# Patient Record
Sex: Female | Born: 1950
Health system: Southern US, Community
[De-identification: ages and names within clinical notes are randomized; demographics above are authoritative.]

## PROBLEM LIST (undated history)

## (undated) DIAGNOSIS — B009 Herpesviral infection, unspecified: Secondary | ICD-10-CM

## (undated) DIAGNOSIS — D649 Anemia, unspecified: Secondary | ICD-10-CM

## (undated) DIAGNOSIS — E559 Vitamin D deficiency, unspecified: Secondary | ICD-10-CM

## (undated) DIAGNOSIS — E119 Type 2 diabetes mellitus without complications: Secondary | ICD-10-CM

## (undated) DIAGNOSIS — T8859XA Other complications of anesthesia, initial encounter: Secondary | ICD-10-CM

## (undated) DIAGNOSIS — M1990 Unspecified osteoarthritis, unspecified site: Secondary | ICD-10-CM

## (undated) DIAGNOSIS — Z972 Presence of dental prosthetic device (complete) (partial): Secondary | ICD-10-CM

## (undated) DIAGNOSIS — E7800 Pure hypercholesterolemia, unspecified: Secondary | ICD-10-CM

## (undated) DIAGNOSIS — M7989 Other specified soft tissue disorders: Secondary | ICD-10-CM

## (undated) DIAGNOSIS — T4145XA Adverse effect of unspecified anesthetic, initial encounter: Secondary | ICD-10-CM

## (undated) DIAGNOSIS — I739 Peripheral vascular disease, unspecified: Secondary | ICD-10-CM

## (undated) DIAGNOSIS — Z87898 Personal history of other specified conditions: Secondary | ICD-10-CM

## (undated) DIAGNOSIS — I10 Essential (primary) hypertension: Secondary | ICD-10-CM

## (undated) HISTORY — PX: ROTATOR CUFF REPAIR: SHX139

## (undated) HISTORY — DX: Peripheral vascular disease, unspecified: I73.9

## (undated) HISTORY — PX: COLONOSCOPY: SHX174

## (undated) HISTORY — PX: VARICOSE VEIN SURGERY: SHX832

---
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
## 2012-11-21 ENCOUNTER — Ambulatory Visit: Payer: Self-pay | Admitting: Internal Medicine

## 2015-01-11 ENCOUNTER — Inpatient Hospital Stay: Payer: Self-pay | Admitting: Internal Medicine

## 2015-01-11 LAB — BASIC METABOLIC PANEL
Anion Gap: 6 — ABNORMAL LOW (ref 7–16)
BUN: 15 mg/dL (ref 7–18)
Calcium, Total: 9.1 mg/dL (ref 8.5–10.1)
Chloride: 99 mmol/L (ref 98–107)
Co2: 30 mmol/L (ref 21–32)
Creatinine: 1.1 mg/dL (ref 0.60–1.30)
EGFR (African American): >60
EGFR (Non-African Amer.): 53 — ABNORMAL LOW
Glucose: 106 mg/dL — ABNORMAL HIGH (ref 65–99)
Osmolality: 271 (ref 275–301)
Potassium: 3.3 mmol/L — ABNORMAL LOW (ref 3.5–5.1)
Sodium: 135 mmol/L — ABNORMAL LOW (ref 136–145)

## 2015-01-11 LAB — CBC
HCT: 44.1 % (ref 35.0–47.0)
HGB: 14.2 g/dL (ref 12.0–16.0)
MCH: 22.8 pg — ABNORMAL LOW (ref 26.0–34.0)
MCHC: 32.2 g/dL (ref 32.0–36.0)
MCV: 71 fL — ABNORMAL LOW (ref 80–100)
Platelet: 139 10*3/uL — ABNORMAL LOW (ref 150–440)
RBC: 6.23 10*6/uL — ABNORMAL HIGH (ref 3.80–5.20)
RDW: 15.7 % — ABNORMAL HIGH (ref 11.5–14.5)
WBC: 7.7 10*3/uL (ref 3.6–11.0)

## 2015-01-11 LAB — TROPONIN I: Troponin-I: <0.02 ng/mL

## 2015-01-12 LAB — CBC WITH DIFFERENTIAL/PLATELET
Basophil #: 0 10*3/uL (ref 0.0–0.1)
Basophil %: 0.8 %
Eosinophil #: 0.1 10*3/uL (ref 0.0–0.7)
Eosinophil %: 1.6 %
HCT: 37.1 % (ref 35.0–47.0)
HGB: 12.2 g/dL (ref 12.0–16.0)
Lymphocyte #: 0.7 10*3/uL — ABNORMAL LOW (ref 1.0–3.6)
Lymphocyte %: 12.4 %
MCH: 23.4 pg — ABNORMAL LOW (ref 26.0–34.0)
MCHC: 32.8 g/dL (ref 32.0–36.0)
MCV: 71 fL — ABNORMAL LOW (ref 80–100)
Monocyte #: 0.7 x10 3/mm (ref 0.2–0.9)
Monocyte %: 13 %
Neutrophil #: 4.1 10*3/uL (ref 1.4–6.5)
Neutrophil %: 72.2 %
Platelet: 113 10*3/uL — ABNORMAL LOW (ref 150–440)
RBC: 5.21 10*6/uL — ABNORMAL HIGH (ref 3.80–5.20)
RDW: 15.5 % — ABNORMAL HIGH (ref 11.5–14.5)
WBC: 5.6 10*3/uL (ref 3.6–11.0)

## 2015-01-12 LAB — TROPONIN I
Troponin-I: <0.02 ng/mL
Troponin-I: <0.02 ng/mL

## 2015-01-12 LAB — BASIC METABOLIC PANEL
Anion Gap: 7 (ref 7–16)
BUN: 15 mg/dL (ref 7–18)
Calcium, Total: 8.3 mg/dL — ABNORMAL LOW (ref 8.5–10.1)
Chloride: 105 mmol/L (ref 98–107)
Co2: 27 mmol/L (ref 21–32)
Creatinine: 1.02 mg/dL (ref 0.60–1.30)
EGFR (African American): >60
EGFR (Non-African Amer.): 58 — ABNORMAL LOW
Glucose: 96 mg/dL (ref 65–99)
Osmolality: 278 (ref 275–301)
Potassium: 3.5 mmol/L (ref 3.5–5.1)
Sodium: 139 mmol/L (ref 136–145)

## 2015-01-12 LAB — INFLUENZA A,B,H1N1 - PCR (ARMC)
H1N1 flu by pcr: NOT DETECTED
Influenza A By PCR: NEGATIVE
Influenza B By PCR: NEGATIVE

## 2015-01-13 LAB — BASIC METABOLIC PANEL
Anion Gap: 6 — ABNORMAL LOW (ref 7–16)
BUN: 13 mg/dL (ref 7–18)
Calcium, Total: 7.9 mg/dL — ABNORMAL LOW (ref 8.5–10.1)
Chloride: 110 mmol/L — ABNORMAL HIGH (ref 98–107)
Co2: 27 mmol/L (ref 21–32)
Creatinine: 0.91 mg/dL (ref 0.60–1.30)
EGFR (African American): >60
EGFR (Non-African Amer.): >60
Glucose: 116 mg/dL — ABNORMAL HIGH (ref 65–99)
Osmolality: 286 (ref 275–301)
Potassium: 3.7 mmol/L (ref 3.5–5.1)
Sodium: 143 mmol/L (ref 136–145)

## 2015-01-13 LAB — WBC: WBC: 4.3 10*3/uL (ref 3.6–11.0)

## 2015-01-14 LAB — BASIC METABOLIC PANEL
Anion Gap: 6 — ABNORMAL LOW (ref 7–16)
BUN: 10 mg/dL (ref 7–18)
Calcium, Total: 9.2 mg/dL (ref 8.5–10.1)
Chloride: 109 mmol/L — ABNORMAL HIGH (ref 98–107)
Co2: 27 mmol/L (ref 21–32)
Creatinine: 0.73 mg/dL (ref 0.60–1.30)
EGFR (African American): >60
EGFR (Non-African Amer.): >60
Glucose: 110 mg/dL — ABNORMAL HIGH (ref 65–99)
Osmolality: 283 (ref 275–301)
Potassium: 3.6 mmol/L (ref 3.5–5.1)
Sodium: 142 mmol/L (ref 136–145)

## 2015-01-14 LAB — CBC WITH DIFFERENTIAL/PLATELET
Basophil #: 0 10*3/uL (ref 0.0–0.1)
Basophil %: 0.8 %
Eosinophil #: 0.2 10*3/uL (ref 0.0–0.7)
Eosinophil %: 4.1 %
HCT: 41.2 % (ref 35.0–47.0)
HGB: 13.2 g/dL (ref 12.0–16.0)
Lymphocyte #: 1.2 10*3/uL (ref 1.0–3.6)
Lymphocyte %: 28.6 %
MCH: 22.9 pg — ABNORMAL LOW (ref 26.0–34.0)
MCHC: 31.9 g/dL — ABNORMAL LOW (ref 32.0–36.0)
MCV: 72 fL — ABNORMAL LOW (ref 80–100)
Monocyte #: 0.6 x10 3/mm (ref 0.2–0.9)
Monocyte %: 15.1 %
Neutrophil #: 2.2 10*3/uL (ref 1.4–6.5)
Neutrophil %: 51.4 %
Platelet: 151 10*3/uL (ref 150–440)
RBC: 5.75 10*6/uL — ABNORMAL HIGH (ref 3.80–5.20)
RDW: 15.8 % — ABNORMAL HIGH (ref 11.5–14.5)
WBC: 4.2 10*3/uL (ref 3.6–11.0)

## 2015-01-14 LAB — BETA STREP CULTURE(ARMC)

## 2015-02-14 ENCOUNTER — Emergency Department: Payer: Self-pay | Admitting: Emergency Medicine

## 2015-04-10 NOTE — Discharge Summary (Signed)
PATIENT NAME:  Sara Ballard, [name]#:  [id number] DATE OF BIRTH:  12/09/1951  DATE OF ADMISSION:  01/11/2015 DATE OF DISCHARGE:  01/14/2015  ADMITTING PHYSICIAN: Cletis Athensavid K. Hower, MD   DISCHARGING PHYSICIAN: Enid Baasadhika Clifford Coudriet, MD  PRIMARY CARE PHYSICIAN: Nonlocal.   CONSULTATIONS IN THE HOSPITAL: None.   DISCHARGE DIAGNOSES:  1. Sepsis. 2. Left lower lobe pneumonia.  3. Hypertension.  4. Hyperlipidemia.   DISCHARGE HOME MEDICATIONS:  1. Hydrocortisone 0.2% ointment topically to white patches once a day.  2. Lipitor 10 mg p.o. daily.  3. Losartan 25 mg p.o. daily.  4. Aspirin 81 mg p.o. daily.  5. Levaquin 750 mg p.o. daily for 4 more days.  6. Cepacol lozenges every 6 hours as needed for cough, sore throat.  7. Cetirizine 10 mg daily as needed for allergies.  8. Robitussin DM 10 mL q.6 hours p.r.n. for cough.   DISCHARGE DIET: Low-sodium diet.   DISCHARGE ACTIVITY: Tolerated.    FOLLOWUP INSTRUCTIONS:  PCP followup in 1 to 2 weeks.   HOME OXYGEN: None.   LABORATORY AND IMAGING STUDIES PRIOR TO DISCHARGE: WBC 4.2, hemoglobin 13.2, hematocrit 41.2, platelet count 151,000.   Sodium 142, potassium 3.6, chloride 109, bicarbonate 27, BUN 10, creatinine 0.73, glucose 110, and calcium of 9.2. Troponins negative. Influenza test is negative. Throat cultures negative for beta streptococcus. Chest x-ray on admission showing left lower lobe airspace disease concerning for pneumonia.   BRIEF HOSPITAL COURSE: Sara Ballard is a 64 year old African American female with past medical history significant for hypertension, hyperlipidemia and allergies, who presents to the hospital secondary to worsening upper respiratory tract symptoms, fevers, chills, and tachycardia.   1. Sepsis secondary to left lower lobe pneumonia. Started on IV Levaquin. Blood cultures remain negative. Clinically responded very well to the antibiotics and her cough is improved. She is being discharged home on oral Levaquin  to finish off the course.  2. All her other home medications are being continued without any changes. The patient does say she has some fullness in her ears and has seasonal allergies, so I advised to take Zyrtec as needed and ear, nose and throat followup.   DISCHARGE CONDITION: Stable.   DISCHARGE DISPOSITION: Home.   TIME SPENT ON DISCHARGE: 40 minutes added.   ____________________________ Enid Baasadhika Kairi Tufo, MD rk:ap D: 01/14/2015 15:43:37 ET T: 01/14/2015 16:36:28 ET JOB#: [id number]  cc: Enid Baasadhika Barnell Shieh, MD, <Dictator> Enid BaasADHIKA Dayannara Pascal MD ELECTRONICALLY SIGNED 01/17/2015 17:09

## 2015-04-10 NOTE — H&P (Signed)
PATIENT NAME:  Sara Ballard, Sara Ballard MR#:  [id number] DATE OF BIRTH:  1951-03-08  DATE OF ADMISSION:  01/11/2015  REFERRING PHYSICIAN:  Kathreen Devoid. Paduchowski, MD  PRIMARY CARE PHYSICIAN:  Nonlocal  CHIEF COMPLAINT:  Cough.   HISTORY OF PRESENT ILLNESS:  A 64 year old African-American female with history of hypertension, essential, and hyperlipidemia, unspecified, presenting with a cough. She describes a 2-day duration of cough productive of greenish sputum with associated subjective fevers and chills as well as generalized malaise and fatigue. She denies any chest pain or further symptomatology.   REVIEW OF SYSTEMS: CONSTITUTIONAL:  Positive for fevers, chills, fatigue, and weakness.  EYES:  Denies blurred vision, double vision, or eye pain.  EARS, NOSE, AND THROAT:  Denies tinnitus, ear pain, or hearing loss.  RESPIRATORY:  Positive for cough. Denies wheezing or shortness of breath.  CARDIOVASCULAR:  No chest pain, palpitations, or edema.  GASTROINTESTINAL:  Denies nausea, vomiting, diarrhea, or abdominal pain.  GENITOURINARY:  Denies dysuria or hematuria.  ENDOCRINE:  Denies nocturia or thyroid problems.  HEMATOLOGIC AND LYMPHATIC:  Denies easy bruising or bleeding.  SKIN:  Denies rash or lesions.  MUSCULOSKELETAL:  Denies pain in the neck, back, shoulders, knees, or hips or arthritic symptoms.  NEUROLOGIC:  Denies paralysis or paresthesias.  PSYCHIATRIC:  Denies anxiety or depressive symptoms.   Otherwise, full review of systems performed by me is negative.   PAST MEDICAL HISTORY:  Hypertension, essential; hyperlipidemia, unspecified.   SOCIAL HISTORY:  Denies alcohol, tobacco, or drug usage.   FAMILY HISTORY:  Positive for coronary artery disease on father's side of the family.   ALLERGIES:  PENICILLIN, SULFA DRUGS.   HOME MEDICATIONS:  Include losartan 25 mg p.o. daily, Lipitor 10 mg p.o. at bedtime, hydrochlorothiazide unknown dosage once daily.   PHYSICAL EXAMINATION: VITAL  SIGNS:  Temperature 102.1 degrees Fahrenheit, heart rate 102, respirations 20, blood pressure 132/91, and saturating 96% on room air. Weight is 79.8 kg, BMI 30.2.  GENERAL:  Ill-appearing African-American female currently in minimal distress.  HEAD:  Normocephalic, atraumatic.  EYES:  Pupils are equal, round, and reactive to light. Extraocular muscles are intact. No scleral icterus.  MOUTH:  Moist mucosal membranes. Dentition is intact. No abscess noted.  EARS, NOSE, AND THROAT:  Clear without exudates. No external lesions.  NECK:  Supple. No thyromegaly. No nodules. No JVD.  PULMONARY:  Diminished breath sounds secondary to poor respiratory effort; however, no frank wheezes, rales, or rhonchi. No use of accessory muscles. Poor respiratory effort as stated above.  CHEST:  Nontender to palpation.  CARDIOVASCULAR:  S1 and S2, regular rate and rhythm. No murmurs, rubs, or gallops. No edema. Pedal pulses are 2+ bilaterally.  GASTROINTESTINAL:  Soft, nontender, nondistended. No masses. Positive bowel sounds. No hepatosplenomegaly.  MUSCULOSKELETAL:  No swelling, clubbing, or edema. Range of motion is full in all extremities.  NEUROLOGIC:  Cranial nerves II through XII are intact. No gross focal neurological deficits. Sensation is intact. Reflexes are intact.  SKIN:  No ulcerations, lesions, rash, or cyanosis. Skin is warm and dry. Turgor is intact.  PSYCHIATRIC:  Mood and affect are within normal limits. The patient is awake, alert, and oriented x 3. Insight and judgment are intact.   LABORATORY DATA:  Sodium is 135, potassium 3.3, chloride 99, bicarbonate 30, BUN 15, creatinine 1.1, glucose 106. Troponin is less than 0.02. WBC is 7.7, hemoglobin 14.2, and platelets are 139,000.   Chest x-ray performed reveals hazy left lower lobe airspace disease concerning for pneumonia.  EKG performed reveals inferior lateral T inversions, however, no prior for comparison.   ASSESSMENT AND PLAN:  A 64 year old  African-American female with history of hypertension, essential; hyperlipidemia, unspecified; presenting with a cough and found to have pneumonia.   1.  Community-acquired pneumonia. Supplemental O2 to keep SaO2 greater than 92%. DuoNeb treatments q. 4 hours. Antibiotic coverage with Levaquin.  2.  Abnormal EKG without baseline. We will place on telemetry. Trend cardiac enzymes x 3. She has no cardiac symptoms.  3.  Hyponatremia. IV fluid hydration. Follow sodium level.  4.  Hypokalemia. Replace to goal of 4 to 5.  5.  Hypertension, essential. Continue with home medications, hold hydrochlorothiazide.  6.  Hyperlipidemia. Continue with statin therapy.  7.  Venous thromboembolism prophylaxis with heparin subcutaneously.   CODE STATUS:  The patient is a full code.   TIME SPENT:  45 minutes.    ____________________________ Cletis Athensavid K. Hower, MD dkh:nb D: 01/11/2015 23:19:07 ET T: 01/12/2015 00:27:13 ET JOB#: [id number]  cc: Cletis Athensavid K. Hower, MD, <Dictator> DAVID Synetta ShadowK HOWER MD ELECTRONICALLY SIGNED 01/12/2015 20:39

## 2015-11-24 ENCOUNTER — Other Ambulatory Visit: Payer: Self-pay | Admitting: Family Medicine

## 2015-11-24 DIAGNOSIS — N644 Mastodynia: Secondary | ICD-10-CM

## 2016-09-12 ENCOUNTER — Other Ambulatory Visit (INDEPENDENT_AMBULATORY_CARE_PROVIDER_SITE_OTHER): Payer: Self-pay | Admitting: Vascular Surgery

## 2016-09-12 DIAGNOSIS — I8311 Varicose veins of right lower extremity with inflammation: Secondary | ICD-10-CM

## 2016-09-13 ENCOUNTER — Ambulatory Visit (INDEPENDENT_AMBULATORY_CARE_PROVIDER_SITE_OTHER): Payer: 59

## 2016-09-13 DIAGNOSIS — I8311 Varicose veins of right lower extremity with inflammation: Secondary | ICD-10-CM | POA: Diagnosis not present

## 2016-09-27 ENCOUNTER — Ambulatory Visit (INDEPENDENT_AMBULATORY_CARE_PROVIDER_SITE_OTHER): Payer: 59 | Admitting: Vascular Surgery

## 2016-09-27 ENCOUNTER — Encounter (INDEPENDENT_AMBULATORY_CARE_PROVIDER_SITE_OTHER): Payer: Self-pay | Admitting: Vascular Surgery

## 2016-09-27 VITALS — BP 127/88 | HR 62 | Resp 15 | Ht 64.5 in | Wt 183.0 lb

## 2016-09-27 DIAGNOSIS — I8312 Varicose veins of left lower extremity with inflammation: Secondary | ICD-10-CM

## 2016-09-27 DIAGNOSIS — M79604 Pain in right leg: Secondary | ICD-10-CM

## 2016-09-27 DIAGNOSIS — I8311 Varicose veins of right lower extremity with inflammation: Secondary | ICD-10-CM

## 2016-09-27 DIAGNOSIS — M79605 Pain in left leg: Secondary | ICD-10-CM

## 2016-09-27 DIAGNOSIS — I872 Venous insufficiency (chronic) (peripheral): Secondary | ICD-10-CM

## 2016-09-27 DIAGNOSIS — M79609 Pain in unspecified limb: Secondary | ICD-10-CM | POA: Insufficient documentation

## 2016-09-27 NOTE — Progress Notes (Signed)
Left GSV laser ablation

## 2016-09-27 NOTE — Progress Notes (Signed)
The patient's left lower extremity was sterilely prepped and draped.  The ultrasound machine was used to visualize the left great saphenous vein throughout its course.  A segment below the knee was selected for access.  The saphenous vein was accessed without difficulty using ultrasound guidance with a micropuncture needle.   An 0.018  wire was placed beyond the saphenofemoral junction through the sheath and the microneedle was removed.  The 65 cm sheath was then placed over the wire and the wire and dilator were removed.  The laser fiber was placed through the sheath and its tip was placed approximately 2 cm below the saphenofemoral junction.  Tumescent anesthesia was then created with a dilute lidocaine solution.  Laser energy was then delivered with constant withdrawal of the sheath and laser fiber.  Approximately 1492 Joules of energy were delivered over a length of 41 cm.  Sterile dressings were placed.  The patient tolerated the procedure well without complications.

## 2016-10-01 ENCOUNTER — Ambulatory Visit (INDEPENDENT_AMBULATORY_CARE_PROVIDER_SITE_OTHER): Payer: 59

## 2016-10-01 DIAGNOSIS — I8312 Varicose veins of left lower extremity with inflammation: Secondary | ICD-10-CM

## 2016-10-01 DIAGNOSIS — I8311 Varicose veins of right lower extremity with inflammation: Secondary | ICD-10-CM

## 2016-10-15 ENCOUNTER — Ambulatory Visit (INDEPENDENT_AMBULATORY_CARE_PROVIDER_SITE_OTHER): Payer: 59 | Admitting: Vascular Surgery

## 2016-11-05 ENCOUNTER — Ambulatory Visit (INDEPENDENT_AMBULATORY_CARE_PROVIDER_SITE_OTHER): Payer: 59 | Admitting: Vascular Surgery

## 2016-11-05 ENCOUNTER — Encounter (INDEPENDENT_AMBULATORY_CARE_PROVIDER_SITE_OTHER): Payer: Self-pay | Admitting: Vascular Surgery

## 2016-11-05 VITALS — BP 148/100 | HR 67 | Resp 17 | Ht 64.0 in | Wt 186.0 lb

## 2016-11-05 DIAGNOSIS — I872 Venous insufficiency (chronic) (peripheral): Secondary | ICD-10-CM

## 2016-11-05 DIAGNOSIS — M79605 Pain in left leg: Secondary | ICD-10-CM | POA: Diagnosis not present

## 2016-11-05 DIAGNOSIS — M79604 Pain in right leg: Secondary | ICD-10-CM

## 2016-11-05 DIAGNOSIS — I83813 Varicose veins of bilateral lower extremities with pain: Secondary | ICD-10-CM

## 2016-11-05 NOTE — Progress Notes (Signed)
MRN : [id number]  Sara Ballard is a 65 y.o. (05/31/1951) female who presents with chief complaint of  Chief Complaint  Patient presents with  . Follow-up  .  History of Present Illness: The patient returns to the office for followup status post laser ablation of the great saphenous vein. The patient notes multiple residual varicosities bilaterally which continued to hurt with dependent positions and remained tender to palpation. The patient's swelling is unchanged from preoperative status. The patient continues to wear graduated compression stockings on a daily basis but these are not eliminating the pain and discomfort. The patient continues to use over-the-counter anti-inflammatory medications to treat the pain and related symptoms but this has not given the patient relief. The patient notes the pain in the lower extremities is causing problems with daily exercise, problems at work and even with household activities such as preparing meals and doing dishes.  The patient is otherwise done well and there have been no complications related to the laser procedure or interval changes in the patient's overall   Venous ultrasound post laser shows successful laser ablation, no DVT identified. Current Meds  Medication Sig  . aspirin EC 81 MG tablet Take by mouth.  . baclofen (LIORESAL) 10 MG tablet Take 10 mg by mouth 3 (three) times daily.  . calcium acetate (PHOSLO) 667 MG capsule Take by mouth 3 (three) times daily with meals.  . Cholecalciferol (VITAMIN D-1000 MAX ST) 1000 units tablet Take by mouth.  . ferrous sulfate 325 (65 FE) MG tablet Take 325 mg by mouth daily with breakfast.  . gabapentin (NEURONTIN) 100 MG capsule Take by mouth.  . Loratadine 10 MG CAPS Take by mouth.  . losartan-hydrochlorothiazide (HYZAAR) 100-25 MG tablet Take by mouth.  . Vitamin D, Ergocalciferol, (DRISDOL) 50000 units CAPS capsule Take by mouth.  . Vitamin E-Tocotrienols (MAXILIFE RICE TOCOTRIENOLS PO)  Take by mouth.    Past Medical History:  Diagnosis Date  . Peripheral vascular disease (HCC)     No past surgical history on file.  Social History Social History  Substance Use Topics  . Smoking status: Never Smoker  . Smokeless tobacco: Never Used  . Alcohol use No    Family History No family history on file. No family history of bleeding/clotting disorders, porphyria or autoimmune disease   Allergies  Allergen Reactions  . Atorvastatin Other (See Comments)    arthralgia  . Lisinopril Nausea Only  . Amlodipine Nausea Only  . Penicillin G Nausea Only  . Sulfur Other (See Comments)  . Sulfa Antibiotics Palpitations     REVIEW OF SYSTEMS (Negative unless checked)  Constitutional: [] Weight loss  [] Fever  [] Chills Cardiac: [] Chest pain   [] Chest pressure   [] Palpitations   [] Shortness of breath when laying flat   [] Shortness of breath with exertion. Vascular:  [] Pain in legs with walking   [] Pain in legs at rest  [] History of DVT   [] Phlebitis   [x] Swelling in legs   [x] Varicose veins   [] Non-healing ulcers Pulmonary:   [] Uses home oxygen   [] Productive cough   [] Hemoptysis   [] Wheeze  [] COPD   [] Asthma Neurologic:  [] Dizziness   [] Seizures   [] History of stroke   [] History of TIA  [] Aphasia   [] Vissual changes   [] Weakness or numbness in arm   [] Weakness or numbness in leg Musculoskeletal:   [] Joint swelling   [] Joint pain   [] Low back pain Hematologic:  [] Easy bruising  [] Easy bleeding   [] Hypercoagulable state   []   Anemic Gastrointestinal:  [] Diarrhea   [] Vomiting  [] Gastroesophageal reflux/heartburn   [] Difficulty swallowing. Genitourinary:  [] Chronic kidney disease   [] Difficult urination  [] Frequent urination   [] Blood in urine Skin:  [] Rashes   [] Ulcers  Psychological:  [] History of anxiety   []  History of major depression.  Physical Examination  Vitals:   11/05/16 1118  BP: (!) 148/100  Pulse: 67  Resp: 17  Weight: 186 lb (84.4 kg)  Height: 5\' 4"  (1.626 m)     Body mass index is 31.93 kg/m. Gen: WD/WN, NAD Head: Many/AT, No temporalis wasting.  Ear/Nose/Throat: Hearing grossly intact, nares w/o erythema or drainage, poor dentition Eyes: PER, EOMI, sclera nonicteric.  Neck: Supple, no masses.  No bruit or JVD.  Pulmonary:  Good air movement, clear to auscultation bilaterally, no use of accessory muscles.  Cardiac: RRR, normal S1, S2, no Murmurs. Vascular:  Large varicose veins noted >10 mm Vessel Right Left  Radial Palpable Palpable  Ulnar Palpable Palpable  Brachial Palpable Palpable  Carotid Palpable Palpable  Femoral Palpable Palpable  Popliteal Palpable Palpable  PT Palpable Palpable  DP Palpable Palpable   Gastrointestinal: soft, non-distended. No guarding/no peritoneal signs.  Musculoskeletal: M/S 5/5 throughout.  No deformity or atrophy.  Neurologic: CN 2-12 intact. Pain and light touch intact in extremities.  Symmetrical.  Speech is fluent. Motor exam as listed above. Psychiatric: Judgment intact, Mood & affect appropriate for pt's clinical situation. Dermatologic: No rashes or ulcers noted.  No changes consistent with cellulitis. Lymph : No Cervical lymphadenopathy, no lichenification or skin changes of chronic lymphedema.  CBC Lab Results  Component Value Date   WBC 4.2 01/14/2015   HGB 13.2 01/14/2015   HCT 41.2 01/14/2015   MCV 72 (L) 01/14/2015   PLT 151 01/14/2015    BMET    Component Value Date/Time   NA 142 01/14/2015 0649   K 3.6 01/14/2015 0649   CL 109 (H) 01/14/2015 0649   CO2 27 01/14/2015 0649   GLUCOSE 110 (H) 01/14/2015 0649   BUN 10 01/14/2015 0649   CREATININE 0.73 01/14/2015 0649   CALCIUM 9.2 01/14/2015 0649   GFRNONAA >60 01/14/2015 0649   GFRAA >60 01/14/2015 0649   CrCl cannot be calculated (Patient's most recent lab result is older than the maximum 21 days allowed.).  COAG No results found for: INR, PROTIME  Radiology No results found.   Assessment/Plan 1. Pain in both lower  extremities See #2  2. Varicose veins of both lower extremities with pain Recommend:  The patient has had successful ablation of the previously incompetent saphenous venous system but still has persistent symptoms of pain and swelling that are having a negative impact on daily life and daily activities.  Patient should undergo injection sclerotherapy to treat the residual varicosities.  The risks, benefits and alternative therapies were reviewed in detail with the patient.  All questions were answered.  The patient agrees to proceed with sclerotherapy at their convenience.  The patient will continue wearing the graduated compression stockings and using the over-the-counter pain medications to treat her symptoms.    3. Chronic venous insufficiency No surgery or intervention at this point in time.    I have had a long discussion with the patient regarding venous insufficiency and why it  causes symptoms. I have discussed with the patient the chronic skin changes that accompany venous insufficiency and the long term sequela such as infection and ulceration.  Patient will begin wearing graduated compression stockings class 1 (20-30 mmHg)  or compression wraps on a daily basis a prescription was given. The patient will put the stockings on first thing in the morning and removing them in the evening. The patient is instructed specifically not to sleep in the stockings.    In addition, behavioral modification including several periods of elevation of the lower extremities during the day will be continued. I have demonstrated that proper elevation is a position with the ankles at heart level.  The patient is instructed to begin routine exercise, especially walking on a daily basis   Levora DredgeGregory Schnier, MD  11/05/2016 12:51 PM

## 2017-10-24 DIAGNOSIS — F418 Other specified anxiety disorders: Secondary | ICD-10-CM | POA: Diagnosis not present

## 2017-10-24 DIAGNOSIS — I10 Essential (primary) hypertension: Secondary | ICD-10-CM | POA: Diagnosis not present

## 2017-10-24 DIAGNOSIS — Z23 Encounter for immunization: Secondary | ICD-10-CM | POA: Diagnosis not present

## 2018-03-25 DIAGNOSIS — Z780 Asymptomatic menopausal state: Secondary | ICD-10-CM | POA: Diagnosis not present

## 2018-03-25 DIAGNOSIS — Z0000 Encounter for general adult medical examination without abnormal findings: Secondary | ICD-10-CM | POA: Diagnosis not present

## 2018-03-25 DIAGNOSIS — I10 Essential (primary) hypertension: Secondary | ICD-10-CM | POA: Diagnosis not present

## 2018-03-26 ENCOUNTER — Other Ambulatory Visit: Payer: Self-pay | Admitting: Family Medicine

## 2018-03-26 DIAGNOSIS — Z780 Asymptomatic menopausal state: Secondary | ICD-10-CM

## 2018-04-01 DIAGNOSIS — E876 Hypokalemia: Secondary | ICD-10-CM | POA: Diagnosis not present

## 2018-04-22 ENCOUNTER — Other Ambulatory Visit: Payer: Self-pay

## 2018-04-22 ENCOUNTER — Ambulatory Visit
Admission: EM | Admit: 2018-04-22 | Discharge: 2018-04-22 | Disposition: A | Discharge status: Home / Self Care | Payer: PPO | Attending: Family Medicine | Admitting: Family Medicine

## 2018-04-22 DIAGNOSIS — I10 Essential (primary) hypertension: Secondary | ICD-10-CM

## 2018-04-22 DIAGNOSIS — R42 Dizziness and giddiness: Secondary | ICD-10-CM

## 2018-04-22 DIAGNOSIS — R51 Headache: Secondary | ICD-10-CM

## 2018-04-22 LAB — URINALYSIS, COMPLETE (UACMP) WITH MICROSCOPIC
Bacteria, UA: NONE SEEN
Bilirubin Urine: NEGATIVE
Glucose, UA: NEGATIVE mg/dL
Hgb urine dipstick: NEGATIVE
Ketones, ur: NEGATIVE mg/dL
Leukocytes, UA: NEGATIVE
Nitrite: NEGATIVE
Protein, ur: NEGATIVE mg/dL
RBC / HPF: NONE SEEN RBC/hpf (ref 0–5)
Specific Gravity, Urine: 1.01 (ref 1.005–1.030)
pH: 7.5 (ref 5.0–8.0)

## 2018-04-22 LAB — BASIC METABOLIC PANEL
Anion gap: 11 (ref 5–15)
BUN: 15 mg/dL (ref 6–20)
CO2: 28 mmol/L (ref 22–32)
Calcium: 9.4 mg/dL (ref 8.9–10.3)
Chloride: 102 mmol/L (ref 101–111)
Creatinine, Ser: 0.77 mg/dL (ref 0.44–1.00)
GFR calc Af Amer: >60 mL/min (ref >60)
GFR calc non Af Amer: >60 mL/min (ref >60)
Glucose, Bld: 84 mg/dL (ref 65–99)
Potassium: 3.7 mmol/L (ref 3.5–5.1)
Sodium: 141 mmol/L (ref 135–145)

## 2018-04-22 MED ORDER — KETOCONAZOLE 2 % EX CREA: 1.0000 "application " | TOPICAL_CREAM | Freq: Every day | CUTANEOUS | 0 refills | Status: AC

## 2018-04-22 NOTE — ED Provider Notes (Signed)
[facility]    CSN: [id number] Arrival date & time: 04/22/18  1308     History   Chief Complaint Chief Complaint  Patient presents with  . Hypertension    HPI Sara Ballard is a 67 y.o. female.   HPI  67 year old female presents with elevated blood pressure disease (156/105) took at home yesterday.  Also has a slight lightheaded feeling today.  Today she had left-sided neck pain and headache which has been plaguing her for years.  She is recently started an exercise program may have exacerbated it.  She has no upper extremity radicular symptoms.  Was recently placed on propranolol for anxiety and palpitations.  Has been taking losartan hydrochlorothiazide 100-25.  Has had some vaginal irritation recently and was concerned that it may be a urinary tract infection.  She states she has no vaginal discharge no fever chills no nausea vomiting         Past Medical History:  Diagnosis Date  . Peripheral vascular disease [facility])     Patient Active Problem List   Diagnosis Date Noted  . Varicose veins of both lower extremities with pain 11/05/2016  . Pain in limb 09/27/2016  . Chronic venous insufficiency 09/27/2016    Past Surgical History:  Procedure Laterality Date  . VARICOSE VEIN SURGERY      OB History   None      Home Medications    Prior to Admission medications   Medication Sig Start Date End Date Taking? Authorizing Provider  aspirin EC 81 MG tablet Take by mouth.   Yes [provider]  calcium acetate (PHOSLO) 667 MG capsule Take by mouth 3 (three) times daily with meals.   Yes [provider]  Cholecalciferol (VITAMIN D-1000 MAX ST) 1000 units tablet Take by mouth.   Yes [provider]  ferrous sulfate 325 (65 FE) MG tablet Take 325 mg by mouth daily with breakfast.   Yes [provider]  Glucos-Chondroit-Collag-Hyal (GLUCOSAMINE CHONDROIT-COLLAGEN) CAPS Take by mouth.   Yes [provider]    Loratadine 10 MG CAPS Take by mouth.   Yes [provider]  losartan-hydrochlorothiazide (HYZAAR) 100-25 MG tablet Take by mouth. 09/26/16  Yes [provider]  Multiple Vitamin (MULTI-VITAMINS) TABS Take by mouth.   Yes [provider]  polyethylene glycol powder (GLYCOLAX/MIRALAX) powder Take as directed for colonoscopy prep. 04/04/16  Yes [provider]  potassium chloride (K-DUR,KLOR-CON) 10 MEQ tablet Take by mouth. 04/02/18 04/02/19 Yes [provider]  pravastatin (PRAVACHOL) 20 MG tablet  03/26/18  Yes [provider]  propranolol (INDERAL) 20 MG tablet Take by mouth. 10/24/17 10/24/18 Yes [provider]  Vitamin E-Tocotrienols (MAXILIFE RICE TOCOTRIENOLS PO) Take by mouth.   Yes [provider]  ketoconazole (NIZORAL) 2 % cream Apply 1 application topically daily. 04/22/18   Lutricia Feil, PA-C    Family History Family History  Problem Relation Age of Onset  . Diabetes Mother   . Heart disease Father     Social History Social History   Tobacco Use  . Smoking status: Never Smoker  . Smokeless tobacco: Never Used  Substance Use Topics  . Alcohol use: No  . Drug use: No     Allergies   Atorvastatin; Lisinopril; Amlodipine; Penicillin g; Sulfur; and Sulfa antibiotics   Review of Systems Review of Systems  Constitutional: Negative for activity change, appetite change, chills, fatigue and fever.  Genitourinary: Negative for vaginal discharge.  Neurological: Positive for  light-headedness.  All other systems reviewed and are negative.    Physical Exam Triage Vital Signs ED Triage Vitals  Enc Vitals Group     BP 04/22/18 1321 (!) 147/95     Pulse Rate 04/22/18 1321 62     Resp 04/22/18 1321 17     Temp 04/22/18 1321 98.4 F (36.9 C)     Temp Source 04/22/18 1321 Oral     SpO2 04/22/18 1321 99 %     Weight 04/22/18 1320 179 lb (81.2 kg)     Height 04/22/18 1320  (1.651 m)     Head  Circumference --      Peak Flow --      Pain Score 04/22/18 1320 2     Pain Loc --      Pain Edu? --      Excl. in GC? --    No data found.  Updated Vital Signs BP (!) 136/97 (BP Location: Right Arm)   Pulse 62   Temp 98.4 F (36.9 C) (Oral)   Resp 17   Ht  (1.651 m)   Wt 179 lb (81.2 kg)   SpO2 99%   BMI 29.79 kg/m   Visual Acuity Right Eye Distance:   Left Eye Distance:   Bilateral Distance:    Right Eye Near:   Left Eye Near:    Bilateral Near:     Physical Exam  Constitutional: She is oriented to person, place, and time. She appears well-developed and well-nourished. No distress.  HENT:  Head: Normocephalic.  Eyes: Pupils are equal, round, and reactive to light. Right eye exhibits no discharge. Left eye exhibits no discharge.  Neck: Normal range of motion.  Pulmonary/Chest: Effort normal and breath sounds normal.  Musculoskeletal: Normal range of motion.  Neurological: She is alert and oriented to person, place, and time.  Skin: Skin is warm and dry. She is not diaphoretic.  Psychiatric: She has a normal mood and affect. Her behavior is normal. Judgment and thought content normal.  Nursing note and vitals reviewed.    UC Treatments / Results  Labs (all labs ordered are listed, but only abnormal results are displayed) Labs Reviewed  URINALYSIS, COMPLETE (UACMP) WITH MICROSCOPIC - Abnormal; Notable for the following components:      Result Value   Color, Urine STRAW (*)    All other components within normal limits  BASIC METABOLIC PANEL    EKG None  Radiology No results found.  Procedures Procedures (including critical care time)  Medications Ordered in UC Medications - No data to display  Initial Impression / Assessment and Plan / UC Course  I have reviewed the triage vital signs and the nursing notes.  Pertinent labs & imaging results that were available during my care of the patient were reviewed by me and considered in my medical  decision making (see chart for details).     Plan: 1. Test/x-ray results and diagnosis reviewed with patient 2. rx as per orders; risks, benefits, potential side effects reviewed with patient 3. Recommend supportive treatment with developing a blood pressure log date time blood pressure and pulse taken randomly 3 times a week give this to the primary care physician at your next visit.  For her external vaginal irritation I will provide her with Nizoral cream.  If that does not improve she should also follow-up with her primary care physician. Assured that her blood pressure is not urgent today although they may seem high to her. It  was at an Acceptable level at the time of her discharge. 4. F/u prn if symptoms worsen or don't improve  Final Clinical Impressions(s) / UC Diagnoses   Final diagnoses:  Essential hypertension   Discharge Instructions   None    ED Prescriptions    Medication Sig Dispense Auth. Provider   ketoconazole (NIZORAL) 2 % cream Apply 1 application topically daily. 15 g Lutricia Feil, PA-C     Controlled Substance Prescriptions [place] Controlled Substance Registry consulted? Not Applicable   Lutricia Feil, PA-C 04/22/18 [id number]

## 2018-04-22 NOTE — ED Triage Notes (Signed)
Patient complains of elevated blood pressure (156/105) that started yesterday. Patient states that she has noticed a slight lightheaded feeling. Patient complains of left side neck pain and headache (nagging).

## 2018-07-10 DIAGNOSIS — Z1239 Encounter for other screening for malignant neoplasm of breast: Secondary | ICD-10-CM | POA: Diagnosis not present

## 2018-07-10 DIAGNOSIS — I10 Essential (primary) hypertension: Secondary | ICD-10-CM | POA: Diagnosis not present

## 2018-07-23 DIAGNOSIS — M8588 Other specified disorders of bone density and structure, other site: Secondary | ICD-10-CM | POA: Diagnosis not present

## 2018-07-23 DIAGNOSIS — Z1231 Encounter for screening mammogram for malignant neoplasm of breast: Secondary | ICD-10-CM | POA: Diagnosis not present

## 2018-07-23 DIAGNOSIS — Z780 Asymptomatic menopausal state: Secondary | ICD-10-CM | POA: Diagnosis not present

## 2018-07-23 DIAGNOSIS — R928 Other abnormal and inconclusive findings on diagnostic imaging of breast: Secondary | ICD-10-CM | POA: Diagnosis not present

## 2018-08-05 DIAGNOSIS — E876 Hypokalemia: Secondary | ICD-10-CM | POA: Diagnosis not present

## 2018-08-05 DIAGNOSIS — Z0130 Encounter for examination of blood pressure without abnormal findings: Secondary | ICD-10-CM | POA: Diagnosis not present

## 2018-08-25 DIAGNOSIS — R928 Other abnormal and inconclusive findings on diagnostic imaging of breast: Secondary | ICD-10-CM | POA: Diagnosis not present

## 2018-12-17 DIAGNOSIS — S93501A Unspecified sprain of right great toe, initial encounter: Secondary | ICD-10-CM | POA: Diagnosis not present

## 2018-12-17 DIAGNOSIS — M79671 Pain in right foot: Secondary | ICD-10-CM | POA: Diagnosis not present

## 2019-01-22 DIAGNOSIS — Z114 Encounter for screening for human immunodeficiency virus [HIV]: Secondary | ICD-10-CM | POA: Diagnosis not present

## 2019-01-22 DIAGNOSIS — E876 Hypokalemia: Secondary | ICD-10-CM | POA: Diagnosis not present

## 2019-01-22 DIAGNOSIS — I10 Essential (primary) hypertension: Secondary | ICD-10-CM | POA: Diagnosis not present

## 2019-01-22 DIAGNOSIS — N952 Postmenopausal atrophic vaginitis: Secondary | ICD-10-CM | POA: Diagnosis not present

## 2019-01-22 DIAGNOSIS — Z113 Encounter for screening for infections with a predominantly sexual mode of transmission: Secondary | ICD-10-CM | POA: Diagnosis not present

## 2019-01-22 DIAGNOSIS — Z124 Encounter for screening for malignant neoplasm of cervix: Secondary | ICD-10-CM | POA: Diagnosis not present

## 2019-07-23 DIAGNOSIS — Z780 Asymptomatic menopausal state: Secondary | ICD-10-CM | POA: Diagnosis not present

## 2019-07-23 DIAGNOSIS — I10 Essential (primary) hypertension: Secondary | ICD-10-CM | POA: Diagnosis not present

## 2019-07-23 DIAGNOSIS — Z1239 Encounter for other screening for malignant neoplasm of breast: Secondary | ICD-10-CM | POA: Diagnosis not present

## 2019-07-23 DIAGNOSIS — J309 Allergic rhinitis, unspecified: Secondary | ICD-10-CM | POA: Diagnosis not present

## 2019-08-03 ENCOUNTER — Other Ambulatory Visit: Payer: Self-pay | Admitting: Family Medicine

## 2019-08-03 DIAGNOSIS — Z780 Asymptomatic menopausal state: Secondary | ICD-10-CM

## 2019-08-03 DIAGNOSIS — Z1231 Encounter for screening mammogram for malignant neoplasm of breast: Secondary | ICD-10-CM

## 2019-09-22 DIAGNOSIS — H2513 Age-related nuclear cataract, bilateral: Secondary | ICD-10-CM | POA: Diagnosis not present

## 2019-10-07 DIAGNOSIS — I10 Essential (primary) hypertension: Secondary | ICD-10-CM | POA: Diagnosis not present

## 2019-10-07 DIAGNOSIS — H2511 Age-related nuclear cataract, right eye: Secondary | ICD-10-CM | POA: Diagnosis not present

## 2019-10-15 ENCOUNTER — Other Ambulatory Visit: Payer: Self-pay

## 2019-10-15 ENCOUNTER — Other Ambulatory Visit
Admission: RE | Admit: 2019-10-15 | Discharge: 2019-10-15 | Disposition: A | Discharge status: Home / Self Care | Payer: PPO | Source: Ambulatory Visit | Attending: Ophthalmology | Admitting: Ophthalmology

## 2019-10-15 DIAGNOSIS — Z01812 Encounter for preprocedural laboratory examination: Secondary | ICD-10-CM | POA: Diagnosis not present

## 2019-10-15 DIAGNOSIS — Z20828 Contact with and (suspected) exposure to other viral communicable diseases: Secondary | ICD-10-CM | POA: Diagnosis not present

## 2019-10-15 LAB — SARS CORONAVIRUS 2 (TAT 6-24 HRS): SARS Coronavirus 2: NEGATIVE

## 2019-10-15 NOTE — Discharge Instructions (Signed)

## 2019-10-19 ENCOUNTER — Other Ambulatory Visit: Payer: Self-pay

## 2019-10-19 ENCOUNTER — Ambulatory Visit: Payer: PPO | Admitting: Anesthesiology

## 2019-10-19 ENCOUNTER — Encounter
Admission: RE | Disposition: A | Discharge status: Home / Self Care | Payer: Self-pay | Source: Home / Self Care | Attending: Ophthalmology

## 2019-10-19 ENCOUNTER — Ambulatory Visit
Admission: RE | Admit: 2019-10-19 | Discharge: 2019-10-19 | Disposition: A | Discharge status: Home / Self Care | Payer: PPO | Attending: Ophthalmology | Admitting: Ophthalmology

## 2019-10-19 DIAGNOSIS — H2511 Age-related nuclear cataract, right eye: Secondary | ICD-10-CM | POA: Diagnosis not present

## 2019-10-19 DIAGNOSIS — E1136 Type 2 diabetes mellitus with diabetic cataract: Secondary | ICD-10-CM | POA: Diagnosis not present

## 2019-10-19 DIAGNOSIS — H25811 Combined forms of age-related cataract, right eye: Secondary | ICD-10-CM | POA: Diagnosis not present

## 2019-10-19 DIAGNOSIS — E559 Vitamin D deficiency, unspecified: Secondary | ICD-10-CM | POA: Diagnosis not present

## 2019-10-19 DIAGNOSIS — Z79899 Other long term (current) drug therapy: Secondary | ICD-10-CM | POA: Insufficient documentation

## 2019-10-19 DIAGNOSIS — D649 Anemia, unspecified: Secondary | ICD-10-CM | POA: Insufficient documentation

## 2019-10-19 DIAGNOSIS — Z7982 Long term (current) use of aspirin: Secondary | ICD-10-CM | POA: Diagnosis not present

## 2019-10-19 DIAGNOSIS — E7800 Pure hypercholesterolemia, unspecified: Secondary | ICD-10-CM | POA: Insufficient documentation

## 2019-10-19 DIAGNOSIS — I10 Essential (primary) hypertension: Secondary | ICD-10-CM | POA: Insufficient documentation

## 2019-10-19 HISTORY — DX: Type 2 diabetes mellitus without complications: E11.9

## 2019-10-19 HISTORY — DX: Other complications of anesthesia, initial encounter: T88.59XA

## 2019-10-19 HISTORY — DX: Herpesviral infection, unspecified: B00.9

## 2019-10-19 HISTORY — DX: Unspecified osteoarthritis, unspecified site: M19.90

## 2019-10-19 HISTORY — DX: Pure hypercholesterolemia, unspecified: E78.00

## 2019-10-19 HISTORY — DX: Anemia, unspecified: D64.9

## 2019-10-19 HISTORY — DX: Presence of dental prosthetic device (complete) (partial): Z97.2

## 2019-10-19 HISTORY — DX: Other specified soft tissue disorders: M79.89

## 2019-10-19 HISTORY — DX: Vitamin D deficiency, unspecified: E55.9

## 2019-10-19 HISTORY — DX: Personal history of other specified conditions: Z87.898

## 2019-10-19 HISTORY — PX: CATARACT EXTRACTION W/PHACO: SHX586

## 2019-10-19 HISTORY — DX: Essential (primary) hypertension: I10

## 2019-10-19 SURGERY — PHACOEMULSIFICATION, CATARACT, WITH IOL INSERTION
Anesthesia: Monitor Anesthesia Care | Site: Eye | Laterality: Right

## 2019-10-19 MED ORDER — TETRACAINE HCL 0.5 % OP SOLN
1.0000 [drp] | OPHTHALMIC | Status: DC | PRN
  Administered 2019-10-19 (×3): 1 [drp] via OPHTHALMIC

## 2019-10-19 MED ORDER — LIDOCAINE HCL (PF) 2 % IJ SOLN
INTRAOCULAR | Status: DC | PRN
  Administered 2019-10-19: 08:00:00 1 mL via INTRAOCULAR

## 2019-10-19 MED ORDER — MOXIFLOXACIN HCL 0.5 % OP SOLN
OPHTHALMIC | Status: DC | PRN
  Administered 2019-10-19: 0.2 mL via OPHTHALMIC

## 2019-10-19 MED ORDER — ARMC OPHTHALMIC DILATING DROPS
1.0000 "application " | OPHTHALMIC | Status: DC | PRN
  Administered 2019-10-19 (×3): 1 via OPHTHALMIC

## 2019-10-19 MED ORDER — LACTATED RINGERS IV SOLN: INTRAVENOUS | Status: DC

## 2019-10-19 MED ORDER — ONDANSETRON HCL 4 MG/2ML IJ SOLN: 4.0000 mg | Freq: Once | INTRAMUSCULAR | Status: DC | PRN

## 2019-10-19 MED ORDER — EPINEPHRINE PF 1 MG/ML IJ SOLN
INTRAOCULAR | Status: DC | PRN
  Administered 2019-10-19: 08:00:00 76 mL via OPHTHALMIC

## 2019-10-19 MED ORDER — SODIUM HYALURONATE 10 MG/ML IO SOLN
INTRAOCULAR | Status: DC | PRN
  Administered 2019-10-19: 0.55 mL via INTRAOCULAR

## 2019-10-19 MED ORDER — FENTANYL CITRATE (PF) 100 MCG/2ML IJ SOLN
INTRAMUSCULAR | Status: DC | PRN
  Administered 2019-10-19: 50 ug via INTRAVENOUS

## 2019-10-19 MED ORDER — MIDAZOLAM HCL 2 MG/2ML IJ SOLN
INTRAMUSCULAR | Status: DC | PRN
  Administered 2019-10-19: 1 mg via INTRAVENOUS

## 2019-10-19 MED ORDER — SODIUM HYALURONATE 23 MG/ML IO SOLN
INTRAOCULAR | Status: DC | PRN
  Administered 2019-10-19: 0.6 mL via INTRAOCULAR

## 2019-10-19 SURGICAL SUPPLY — 19 items
CANNULA ANT/CHMB 27G (MISCELLANEOUS) ×2 IMPLANT
CANNULA ANT/CHMB 27GA (MISCELLANEOUS) ×6 IMPLANT
DISSECTOR HYDRO NUCLEUS 50X22 (MISCELLANEOUS) ×3 IMPLANT
GLOVE SURG LX 7.5 STRW (GLOVE) ×2
GLOVE SURG LX STRL 7.5 STRW (GLOVE) ×1 IMPLANT
GLOVE SURG SYN 8.5  E (GLOVE) ×2
GLOVE SURG SYN 8.5 E (GLOVE) ×1 IMPLANT
GLOVE SURG SYN 8.5 PF PI (GLOVE) ×1 IMPLANT
GOWN STRL REUS W/ TWL LRG LVL3 (GOWN DISPOSABLE) ×2 IMPLANT
GOWN STRL REUS W/TWL LRG LVL3 (GOWN DISPOSABLE) ×4
LENS IOL TECNIS ITEC 19.5 (Intraocular Lens) ×2 IMPLANT
MARKER SKIN DUAL TIP RULER LAB (MISCELLANEOUS) ×3 IMPLANT
PACK DR. KING ARMS (PACKS) ×3 IMPLANT
PACK EYE AFTER SURG (MISCELLANEOUS) ×3 IMPLANT
PACK OPTHALMIC (MISCELLANEOUS) ×3 IMPLANT
SYR 3ML LL SCALE MARK (SYRINGE) ×3 IMPLANT
SYR TB 1ML LUER SLIP (SYRINGE) ×3 IMPLANT
WATER STERILE IRR 250ML POUR (IV SOLUTION) ×3 IMPLANT
WIPE NON LINTING 3.25X3.25 (MISCELLANEOUS) ×3 IMPLANT

## 2019-10-19 NOTE — Transfer of Care (Signed)
Immediate Anesthesia Transfer of Care Note  Patient: Sara Ballard  Procedure(s) Performed: CATARACT EXTRACTION PHACO AND INTRAOCULAR LENS PLACEMENT (IOC) RIGHT DIABETIC  (Right Eye)  Patient Location: PACU  Anesthesia Type: MAC  Level of Consciousness: awake, alert  and patient cooperative  Airway and Oxygen Therapy: Patient Spontanous Breathing and Patient connected to supplemental oxygen  Post-op Assessment: Post-op Vital signs reviewed, Patient's Cardiovascular Status Stable, Respiratory Function Stable, Patent Airway and No signs of Nausea or vomiting  Post-op Vital Signs: Reviewed and stable  Complications: No apparent anesthesia complications

## 2019-10-19 NOTE — H&P (Signed)

## 2019-10-19 NOTE — Anesthesia Preprocedure Evaluation (Signed)
Anesthesia Evaluation  Patient identified by MRN, date of birth, ID band  Airway Mallampati: II  TM Distance: >3 FB Neck ROM: Full    Dental  (+) Partial Lower, Partial Upper   Pulmonary neg pulmonary ROS,    Pulmonary exam normal        Cardiovascular Exercise Tolerance: Good hypertension, Normal cardiovascular exam     Neuro/Psych negative neurological ROS  negative psych ROS   GI/Hepatic negative GI ROS, Neg liver ROS,   Endo/Other  diabetes  Renal/GU      Musculoskeletal   Abdominal   Peds  Hematology  (+) anemia ,   Anesthesia Other Findings   Reproductive/Obstetrics                             Anesthesia Physical Anesthesia Plan  ASA: II  Anesthesia Plan: MAC   Post-op Pain Management:    Induction: Intravenous  PONV Risk Score and Plan: 2 and Midazolam and Treatment may vary due to age or medical condition  Airway Management Planned: Nasal Cannula and Natural Airway  Additional Equipment:   Intra-op Plan:   Post-operative Plan:   Informed Consent: I have reviewed the patients History and Physical, chart, labs and discussed the procedure including the risks, benefits and alternatives for the proposed anesthesia with the patient or authorized representative who has indicated his/her understanding and acceptance.       Plan Discussed with:   Anesthesia Plan Comments:         Anesthesia Quick Evaluation

## 2019-10-19 NOTE — Anesthesia Procedure Notes (Signed)
Procedure Name: MAC Date/Time: 10/19/2019 7:31 AM Performed by: Vanetta Shawl, CRNA Pre-anesthesia Checklist: Patient identified, Emergency Drugs available, Suction available, Timeout performed and Patient being monitored Patient Re-evaluated:Patient Re-evaluated prior to induction Oxygen Delivery Method: Nasal cannula Placement Confirmation: positive ETCO2

## 2019-10-19 NOTE — Anesthesia Postprocedure Evaluation (Signed)
Anesthesia Post Note  Patient: Cloyde Reams  Procedure(s) Performed: CATARACT EXTRACTION PHACO AND INTRAOCULAR LENS PLACEMENT (IOC) RIGHT DIABETIC  (Right Eye)     Patient location during evaluation: PACU Anesthesia Type: MAC Level of consciousness: awake and alert Pain management: pain level controlled Vital Signs Assessment: post-procedure vital signs reviewed and stable Respiratory status: spontaneous breathing, nonlabored ventilation, respiratory function stable and patient connected to nasal cannula oxygen Cardiovascular status: stable and blood pressure returned to baseline Postop Assessment: no apparent nausea or vomiting Anesthetic complications: no    Adele Barthel Leopoldo Mazzie

## 2019-10-19 NOTE — Anesthesia Postprocedure Evaluation (Signed)
Anesthesia Post Note  Patient: Sara Ballard  Procedure(s) Performed: CATARACT EXTRACTION PHACO AND INTRAOCULAR LENS PLACEMENT (IOC) RIGHT DIABETIC  (Right Eye)     Anesthesia Type: MAC    Vanetta Shawl

## 2019-10-19 NOTE — Op Note (Signed)
OPERATIVE NOTE  Sara Ballard [id number] 10/19/2019   PREOPERATIVE DIAGNOSIS:  Nuclear sclerotic cataract right eye.  H25.11   POSTOPERATIVE DIAGNOSIS:    Nuclear sclerotic cataract right eye.     PROCEDURE:  Phacoemusification with posterior chamber intraocular lens placement of the right eye   LENS:   Implant Name Type Inv. Item Serial No. Manufacturer Lot No. LRB No. Used Action  LENS IOL DIOP 19.5 - [id number] Intraocular Lens LENS IOL DIOP 19.5 [id number] AMO  Right 1 Implanted       Procedure(s): CATARACT EXTRACTION PHACO AND INTRAOCULAR LENS PLACEMENT (IOC) RIGHT DIABETIC  (Right)  PCB00 +19.5   ULTRASOUND TIME: 0 minutes 29.5 seconds.  CDE 2.77   SURGEON:  Benay Pillow, MD, MPH  ANESTHESIOLOGIST: Anesthesiologist: Page, Adele Barthel, MD CRNA: Vanetta Shawl, CRNA   ANESTHESIA:  Topical with tetracaine drops augmented with 1% preservative-free intracameral lidocaine.  ESTIMATED BLOOD LOSS: less than 1 mL.   COMPLICATIONS:  None.   DESCRIPTION OF PROCEDURE:  The patient was identified in the holding room and transported to the operating room and placed in the supine position under the operating microscope.  The right eye was identified as the operative eye and it was prepped and draped in the usual sterile ophthalmic fashion.   A 1.0 millimeter clear-corneal paracentesis was made at the 10:30 position. 0.5 ml of preservative-free 1% lidocaine with epinephrine was injected into the anterior chamber.  The anterior chamber was filled with Healon 5 viscoelastic.  A 2.4 millimeter keratome was used to make a near-clear corneal incision at the 8:00 position.  A curvilinear capsulorrhexis was made with a cystotome and capsulorrhexis forceps.  Balanced salt solution was used to hydrodissect and hydrodelineate the nucleus.   Phacoemulsification was then used in stop and chop fashion to remove the lens nucleus and epinucleus.  The remaining cortex was then removed using the  irrigation and aspiration handpiece. Healon was then placed into the capsular bag to distend it for lens placement.  A lens was then injected into the capsular bag.  The remaining viscoelastic was aspirated.   Wounds were hydrated with balanced salt solution.  The anterior chamber was inflated to a physiologic pressure with balanced salt solution.   Intracameral vigamox 0.1 mL undiluted was injected into the eye and a drop placed onto the ocular surface.  No wound leaks were noted.  The patient was taken to the recovery room in stable condition without complications of anesthesia or surgery  Benay Pillow 10/19/2019, 7:54 AM

## 2019-10-20 ENCOUNTER — Encounter: Payer: Self-pay | Admitting: Ophthalmology

## 2019-10-29 ENCOUNTER — Encounter: Payer: Self-pay | Admitting: *Deleted

## 2019-10-29 ENCOUNTER — Other Ambulatory Visit: Payer: Self-pay

## 2019-10-30 DIAGNOSIS — H2512 Age-related nuclear cataract, left eye: Secondary | ICD-10-CM | POA: Diagnosis not present

## 2019-10-30 DIAGNOSIS — I10 Essential (primary) hypertension: Secondary | ICD-10-CM | POA: Diagnosis not present

## 2019-11-03 NOTE — Discharge Instructions (Signed)

## 2019-11-06 ENCOUNTER — Other Ambulatory Visit: Payer: Self-pay

## 2019-11-06 ENCOUNTER — Other Ambulatory Visit
Admission: RE | Admit: 2019-11-06 | Discharge: 2019-11-06 | Disposition: A | Discharge status: Home / Self Care | Payer: PPO | Source: Ambulatory Visit | Attending: Ophthalmology | Admitting: Ophthalmology

## 2019-11-06 DIAGNOSIS — Z20828 Contact with and (suspected) exposure to other viral communicable diseases: Secondary | ICD-10-CM | POA: Insufficient documentation

## 2019-11-06 DIAGNOSIS — Z01812 Encounter for preprocedural laboratory examination: Secondary | ICD-10-CM | POA: Insufficient documentation

## 2019-11-07 LAB — SARS CORONAVIRUS 2 (TAT 6-24 HRS): SARS Coronavirus 2: NEGATIVE

## 2019-11-09 ENCOUNTER — Ambulatory Visit: Payer: PPO | Admitting: Anesthesiology

## 2019-11-09 ENCOUNTER — Encounter
Admission: RE | Disposition: A | Discharge status: Home / Self Care | Payer: Self-pay | Source: Home / Self Care | Attending: Ophthalmology

## 2019-11-09 ENCOUNTER — Other Ambulatory Visit: Payer: Self-pay

## 2019-11-09 ENCOUNTER — Ambulatory Visit
Admission: RE | Admit: 2019-11-09 | Discharge: 2019-11-09 | Disposition: A | Discharge status: Home / Self Care | Payer: PPO | Attending: Ophthalmology | Admitting: Ophthalmology

## 2019-11-09 DIAGNOSIS — Z888 Allergy status to other drugs, medicaments and biological substances status: Secondary | ICD-10-CM | POA: Insufficient documentation

## 2019-11-09 DIAGNOSIS — E1136 Type 2 diabetes mellitus with diabetic cataract: Secondary | ICD-10-CM | POA: Insufficient documentation

## 2019-11-09 DIAGNOSIS — Z7982 Long term (current) use of aspirin: Secondary | ICD-10-CM | POA: Insufficient documentation

## 2019-11-09 DIAGNOSIS — Z882 Allergy status to sulfonamides status: Secondary | ICD-10-CM | POA: Diagnosis not present

## 2019-11-09 DIAGNOSIS — H2512 Age-related nuclear cataract, left eye: Secondary | ICD-10-CM | POA: Diagnosis not present

## 2019-11-09 DIAGNOSIS — Z79899 Other long term (current) drug therapy: Secondary | ICD-10-CM | POA: Diagnosis not present

## 2019-11-09 DIAGNOSIS — H25812 Combined forms of age-related cataract, left eye: Secondary | ICD-10-CM | POA: Diagnosis not present

## 2019-11-09 DIAGNOSIS — D649 Anemia, unspecified: Secondary | ICD-10-CM | POA: Insufficient documentation

## 2019-11-09 DIAGNOSIS — Z880 Allergy status to penicillin: Secondary | ICD-10-CM | POA: Diagnosis not present

## 2019-11-09 DIAGNOSIS — E559 Vitamin D deficiency, unspecified: Secondary | ICD-10-CM | POA: Insufficient documentation

## 2019-11-09 DIAGNOSIS — I10 Essential (primary) hypertension: Secondary | ICD-10-CM | POA: Diagnosis not present

## 2019-11-09 DIAGNOSIS — E7800 Pure hypercholesterolemia, unspecified: Secondary | ICD-10-CM | POA: Diagnosis not present

## 2019-11-09 HISTORY — PX: CATARACT EXTRACTION W/PHACO: SHX586

## 2019-11-09 SURGERY — PHACOEMULSIFICATION, CATARACT, WITH IOL INSERTION
Anesthesia: Monitor Anesthesia Care | Site: Eye | Laterality: Left

## 2019-11-09 MED ORDER — ARMC OPHTHALMIC DILATING DROPS
1.0000 "application " | OPHTHALMIC | Status: DC | PRN
  Administered 2019-11-09 (×3): 1 via OPHTHALMIC

## 2019-11-09 MED ORDER — TETRACAINE HCL 0.5 % OP SOLN
1.0000 [drp] | OPHTHALMIC | Status: DC | PRN
  Administered 2019-11-09 (×3): 1 [drp] via OPHTHALMIC

## 2019-11-09 MED ORDER — EPINEPHRINE PF 1 MG/ML IJ SOLN
INTRAOCULAR | Status: DC | PRN
  Administered 2019-11-09: 71 mL via OPHTHALMIC

## 2019-11-09 MED ORDER — ONDANSETRON HCL 4 MG/2ML IJ SOLN
INTRAMUSCULAR | Status: DC | PRN
  Administered 2019-11-09: 4 mg via INTRAVENOUS

## 2019-11-09 MED ORDER — FENTANYL CITRATE (PF) 100 MCG/2ML IJ SOLN
INTRAMUSCULAR | Status: DC | PRN
  Administered 2019-11-09 (×2): 50 ug via INTRAVENOUS

## 2019-11-09 MED ORDER — ACETAMINOPHEN 325 MG PO TABS: 325.0000 mg | ORAL_TABLET | Freq: Once | ORAL | Status: DC

## 2019-11-09 MED ORDER — ACETAMINOPHEN 160 MG/5ML PO SOLN: 325.0000 mg | Freq: Once | ORAL | Status: DC

## 2019-11-09 MED ORDER — SODIUM HYALURONATE 23 MG/ML IO SOLN
INTRAOCULAR | Status: DC | PRN
  Administered 2019-11-09: 0.6 mL via INTRAOCULAR

## 2019-11-09 MED ORDER — LIDOCAINE HCL (PF) 2 % IJ SOLN
INTRAOCULAR | Status: DC | PRN
  Administered 2019-11-09: 1 mL via INTRAOCULAR

## 2019-11-09 MED ORDER — SODIUM HYALURONATE 10 MG/ML IO SOLN
INTRAOCULAR | Status: DC | PRN
  Administered 2019-11-09: 0.55 mL via INTRAOCULAR

## 2019-11-09 MED ORDER — MIDAZOLAM HCL 2 MG/2ML IJ SOLN
INTRAMUSCULAR | Status: DC | PRN
  Administered 2019-11-09: 2 mg via INTRAVENOUS

## 2019-11-09 MED ORDER — MOXIFLOXACIN HCL 0.5 % OP SOLN
OPHTHALMIC | Status: DC | PRN
  Administered 2019-11-09: 0.2 mL via OPHTHALMIC

## 2019-11-09 SURGICAL SUPPLY — 17 items
CANNULA ANT/CHMB 27GA (MISCELLANEOUS) ×6 IMPLANT
DISSECTOR HYDRO NUCLEUS 50X22 (MISCELLANEOUS) ×3 IMPLANT
GLOVE SURG LX 7.5 STRW (GLOVE) ×2
GLOVE SURG LX STRL 7.5 STRW (GLOVE) ×1 IMPLANT
GLOVE SURG SYN 8.5  E (GLOVE) ×2
GLOVE SURG SYN 8.5 E (GLOVE) ×1 IMPLANT
GOWN STRL REUS W/ TWL LRG LVL3 (GOWN DISPOSABLE) ×2 IMPLANT
GOWN STRL REUS W/TWL LRG LVL3 (GOWN DISPOSABLE) ×4
LENS IOL TECNIS ITEC 20.0 (Intraocular Lens) ×3 IMPLANT
MARKER SKIN DUAL TIP RULER LAB (MISCELLANEOUS) ×3 IMPLANT
PACK DR. KING ARMS (PACKS) ×3 IMPLANT
PACK EYE AFTER SURG (MISCELLANEOUS) ×3 IMPLANT
PACK OPTHALMIC (MISCELLANEOUS) ×3 IMPLANT
SYR 3ML LL SCALE MARK (SYRINGE) ×3 IMPLANT
SYR TB 1ML LUER SLIP (SYRINGE) ×3 IMPLANT
WATER STERILE IRR 250ML POUR (IV SOLUTION) ×3 IMPLANT
WIPE NON LINTING 3.25X3.25 (MISCELLANEOUS) ×3 IMPLANT

## 2019-11-09 NOTE — Anesthesia Postprocedure Evaluation (Signed)
Anesthesia Post Note  Patient: Sara Ballard  Procedure(s) Performed: CATARACT EXTRACTION PHACO AND INTRAOCULAR LENS PLACEMENT (IOC) LEFT DIABETIC 2.05  00:28.0 (Left Eye)     Patient location during evaluation: PACU Anesthesia Type: MAC Level of consciousness: awake and alert and oriented Pain management: satisfactory to patient Vital Signs Assessment: post-procedure vital signs reviewed and stable Respiratory status: spontaneous breathing, nonlabored ventilation and respiratory function stable Cardiovascular status: blood pressure returned to baseline and stable Postop Assessment: Adequate PO intake and No signs of nausea or vomiting Anesthetic complications: no    Raliegh Ip

## 2019-11-09 NOTE — H&P (Signed)

## 2019-11-09 NOTE — OR Nursing (Signed)
Spoke with Dr. Edison Pace regarding patients allergy to Sulfa meds, OK to continue with eye drops as ordered.

## 2019-11-09 NOTE — Op Note (Signed)
OPERATIVE NOTE  Sara Ballard [id number] 11/09/2019   PREOPERATIVE DIAGNOSIS:  Nuclear sclerotic cataract left eye.  H25.12   POSTOPERATIVE DIAGNOSIS:    Nuclear sclerotic cataract left eye.     PROCEDURE:  Phacoemusification with posterior chamber intraocular lens placement of the left eye   LENS:   Implant Name Type Inv. Item Serial No. Manufacturer Lot No. LRB No. Used Action  LENS IOL DIOP 20.0 - [id number] Intraocular Lens LENS IOL DIOP 20.0 [id number] AMO  Left 1 Implanted      Procedure(s): CATARACT EXTRACTION PHACO AND INTRAOCULAR LENS PLACEMENT (IOC) LEFT DIABETIC 2.05  00:28.0 (Left)  PCB00 +20.0   ULTRASOUND TIME: 0 minutes 28 seconds.  CDE 2.04   SURGEON:  Benay Pillow, MD, MPH   ANESTHESIA:  Topical with tetracaine drops augmented with 1% preservative-free intracameral lidocaine.  ESTIMATED BLOOD LOSS: <1 mL   COMPLICATIONS:  None.   DESCRIPTION OF PROCEDURE:  The patient was identified in the holding room and transported to the operating room and placed in the supine position under the operating microscope.  The left eye was identified as the operative eye and it was prepped and draped in the usual sterile ophthalmic fashion.   A 1.0 millimeter clear-corneal paracentesis was made at the 5:00 position. 0.5 ml of preservative-free 1% lidocaine with epinephrine was injected into the anterior chamber.  The anterior chamber was filled with Healon 5 viscoelastic.  A 2.4 millimeter keratome was used to make a near-clear corneal incision at the 2:00 position.  A curvilinear capsulorrhexis was made with a cystotome and capsulorrhexis forceps.  Balanced salt solution was used to hydrodissect and hydrodelineate the nucleus.   Phacoemulsification was then used in stop and chop fashion to remove the lens nucleus and epinucleus.  The remaining cortex was then removed using the irrigation and aspiration handpiece. Healon was then placed into the capsular bag to distend it for  lens placement.  A lens was then injected into the capsular bag.  The remaining viscoelastic was aspirated.   Wounds were hydrated with balanced salt solution.  The anterior chamber was inflated to a physiologic pressure with balanced salt solution.  Intracameral vigamox 0.1 mL undiltued was injected into the eye and a drop placed onto the ocular surface.  No wound leaks were noted.  The patient was taken to the recovery room in stable condition without complications of anesthesia or surgery  Benay Pillow 11/09/2019, 9:46 AM

## 2019-11-09 NOTE — Anesthesia Procedure Notes (Signed)
Procedure Name: MAC Performed by: Izetta Dakin, CRNA Pre-anesthesia Checklist: Timeout performed, Patient being monitored, Suction available, Emergency Drugs available and Patient identified Patient Re-evaluated:Patient Re-evaluated prior to induction Oxygen Delivery Method: Nasal cannula

## 2019-11-09 NOTE — Anesthesia Preprocedure Evaluation (Signed)
Anesthesia Evaluation  Patient identified by MRN, date of birth, ID band Patient awake    Reviewed: Allergy & Precautions, H&P , NPO status , Patient's Chart, lab work & pertinent test results  Airway Mallampati: II  TM Distance: >3 FB Neck ROM: full    Dental no notable dental hx. (+) Partial Lower, Partial Upper   Pulmonary neg pulmonary ROS,    Pulmonary exam normal breath sounds clear to auscultation       Cardiovascular Exercise Tolerance: Good hypertension, Normal cardiovascular exam Rhythm:regular Rate:Normal     Neuro/Psych negative neurological ROS  negative psych ROS   GI/Hepatic negative GI ROS, Neg liver ROS,   Endo/Other  diabetes  Renal/GU      Musculoskeletal   Abdominal   Peds  Hematology  (+) Blood dyscrasia, anemia ,   Anesthesia Other Findings   Reproductive/Obstetrics                             Anesthesia Physical  Anesthesia Plan  ASA: II  Anesthesia Plan: MAC   Post-op Pain Management:    Induction: Intravenous  PONV Risk Score and Plan: 2 and Midazolam, Treatment may vary due to age or medical condition and TIVA  Airway Management Planned: Nasal Cannula and Natural Airway  Additional Equipment:   Intra-op Plan:   Post-operative Plan:   Informed Consent: I have reviewed the patients History and Physical, chart, labs and discussed the procedure including the risks, benefits and alternatives for the proposed anesthesia with the patient or authorized representative who has indicated his/her understanding and acceptance.       Plan Discussed with: CRNA  Anesthesia Plan Comments:         Anesthesia Quick Evaluation

## 2019-11-09 NOTE — Transfer of Care (Signed)
Immediate Anesthesia Transfer of Care Note  Patient: Sara Ballard  Procedure(s) Performed: CATARACT EXTRACTION PHACO AND INTRAOCULAR LENS PLACEMENT (IOC) LEFT DIABETIC 2.05  00:28.0 (Left Eye)  Patient Location: PACU  Anesthesia Type: MAC  Level of Consciousness: awake, alert  and patient cooperative  Airway and Oxygen Therapy: Patient Spontanous Breathing and Patient connected to supplemental oxygen  Post-op Assessment: Post-op Vital signs reviewed, Patient's Cardiovascular Status Stable, Respiratory Function Stable, Patent Airway and No signs of Nausea or vomiting  Post-op Vital Signs: Reviewed and stable  Complications: No apparent anesthesia complications

## 2020-01-26 DIAGNOSIS — Z0000 Encounter for general adult medical examination without abnormal findings: Secondary | ICD-10-CM | POA: Diagnosis not present

## 2020-01-26 DIAGNOSIS — E559 Vitamin D deficiency, unspecified: Secondary | ICD-10-CM | POA: Diagnosis not present

## 2020-01-26 DIAGNOSIS — I10 Essential (primary) hypertension: Secondary | ICD-10-CM | POA: Diagnosis not present

## 2020-02-09 DIAGNOSIS — H04123 Dry eye syndrome of bilateral lacrimal glands: Secondary | ICD-10-CM | POA: Diagnosis not present

## 2020-02-09 DIAGNOSIS — Z1231 Encounter for screening mammogram for malignant neoplasm of breast: Secondary | ICD-10-CM | POA: Diagnosis not present

## 2020-03-02 DIAGNOSIS — D509 Iron deficiency anemia, unspecified: Secondary | ICD-10-CM | POA: Diagnosis not present

## 2020-03-02 DIAGNOSIS — Z6832 Body mass index (BMI) 32.0-32.9, adult: Secondary | ICD-10-CM | POA: Diagnosis not present

## 2020-03-02 DIAGNOSIS — E782 Mixed hyperlipidemia: Secondary | ICD-10-CM | POA: Diagnosis not present

## 2020-03-02 DIAGNOSIS — I10 Essential (primary) hypertension: Secondary | ICD-10-CM | POA: Diagnosis not present

## 2020-03-02 DIAGNOSIS — M1990 Unspecified osteoarthritis, unspecified site: Secondary | ICD-10-CM | POA: Diagnosis not present

## 2020-03-02 DIAGNOSIS — Z7189 Other specified counseling: Secondary | ICD-10-CM | POA: Diagnosis not present

## 2020-03-02 DIAGNOSIS — B0089 Other herpesviral infection: Secondary | ICD-10-CM | POA: Diagnosis not present

## 2020-03-14 DIAGNOSIS — M542 Cervicalgia: Secondary | ICD-10-CM | POA: Diagnosis not present

## 2020-03-14 DIAGNOSIS — Z6830 Body mass index (BMI) 30.0-30.9, adult: Secondary | ICD-10-CM | POA: Diagnosis not present

## 2020-06-07 DIAGNOSIS — D509 Iron deficiency anemia, unspecified: Secondary | ICD-10-CM | POA: Diagnosis not present

## 2020-06-07 DIAGNOSIS — R7301 Impaired fasting glucose: Secondary | ICD-10-CM | POA: Diagnosis not present

## 2020-06-07 DIAGNOSIS — I10 Essential (primary) hypertension: Secondary | ICD-10-CM | POA: Diagnosis not present

## 2020-06-07 DIAGNOSIS — Z6830 Body mass index (BMI) 30.0-30.9, adult: Secondary | ICD-10-CM | POA: Diagnosis not present

## 2020-06-07 DIAGNOSIS — E782 Mixed hyperlipidemia: Secondary | ICD-10-CM | POA: Diagnosis not present

## 2020-07-25 DIAGNOSIS — I10 Essential (primary) hypertension: Secondary | ICD-10-CM | POA: Diagnosis not present

## 2020-07-25 DIAGNOSIS — E782 Mixed hyperlipidemia: Secondary | ICD-10-CM | POA: Diagnosis not present

## 2020-07-25 DIAGNOSIS — Z6831 Body mass index (BMI) 31.0-31.9, adult: Secondary | ICD-10-CM | POA: Diagnosis not present

## 2020-07-28 ENCOUNTER — Other Ambulatory Visit: Payer: Self-pay

## 2020-07-28 ENCOUNTER — Ambulatory Visit
Admission: RE | Admit: 2020-07-28 | Discharge: 2020-07-28 | Disposition: A | Discharge status: Home / Self Care | Payer: PPO | Source: Ambulatory Visit | Attending: Family Medicine | Admitting: Family Medicine

## 2020-07-28 ENCOUNTER — Ambulatory Visit
Admission: RE | Admit: 2020-07-28 | Discharge: 2020-07-28 | Disposition: A | Discharge status: Home / Self Care | Payer: PPO | Attending: Family Medicine | Admitting: Family Medicine

## 2020-07-28 ENCOUNTER — Other Ambulatory Visit: Payer: Self-pay | Admitting: Family Medicine

## 2020-07-28 DIAGNOSIS — M25561 Pain in right knee: Secondary | ICD-10-CM

## 2020-07-28 DIAGNOSIS — D563 Thalassemia minor: Secondary | ICD-10-CM | POA: Diagnosis not present

## 2020-07-28 DIAGNOSIS — I10 Essential (primary) hypertension: Secondary | ICD-10-CM | POA: Diagnosis not present

## 2020-07-28 DIAGNOSIS — R7303 Prediabetes: Secondary | ICD-10-CM | POA: Diagnosis not present

## 2020-09-21 ENCOUNTER — Emergency Department: Payer: PPO

## 2020-09-21 ENCOUNTER — Other Ambulatory Visit: Payer: Self-pay

## 2020-09-21 ENCOUNTER — Encounter: Payer: Self-pay | Admitting: Intensive Care

## 2020-09-21 ENCOUNTER — Emergency Department
Admission: EM | Admit: 2020-09-21 | Discharge: 2020-09-21 | Disposition: A | Discharge status: Home / Self Care | Payer: PPO | Attending: Emergency Medicine | Admitting: Emergency Medicine

## 2020-09-21 DIAGNOSIS — Z79899 Other long term (current) drug therapy: Secondary | ICD-10-CM | POA: Diagnosis not present

## 2020-09-21 DIAGNOSIS — J9811 Atelectasis: Secondary | ICD-10-CM | POA: Diagnosis not present

## 2020-09-21 DIAGNOSIS — Z7982 Long term (current) use of aspirin: Secondary | ICD-10-CM | POA: Insufficient documentation

## 2020-09-21 DIAGNOSIS — K2100 Gastro-esophageal reflux disease with esophagitis, without bleeding: Secondary | ICD-10-CM | POA: Diagnosis not present

## 2020-09-21 DIAGNOSIS — R001 Bradycardia, unspecified: Secondary | ICD-10-CM | POA: Insufficient documentation

## 2020-09-21 DIAGNOSIS — I10 Essential (primary) hypertension: Secondary | ICD-10-CM | POA: Diagnosis not present

## 2020-09-21 DIAGNOSIS — R079 Chest pain, unspecified: Secondary | ICD-10-CM | POA: Diagnosis not present

## 2020-09-21 DIAGNOSIS — E119 Type 2 diabetes mellitus without complications: Secondary | ICD-10-CM | POA: Insufficient documentation

## 2020-09-21 DIAGNOSIS — R072 Precordial pain: Secondary | ICD-10-CM | POA: Insufficient documentation

## 2020-09-21 LAB — CBC
HCT: 39.5 % (ref 36.0–46.0)
Hemoglobin: 12.6 g/dL (ref 12.0–15.0)
MCH: 23.7 pg — ABNORMAL LOW (ref 26.0–34.0)
MCHC: 31.9 g/dL (ref 30.0–36.0)
MCV: 74.2 fL — ABNORMAL LOW (ref 80.0–100.0)
Platelets: 152 10*3/uL (ref 150–400)
RBC: 5.32 MIL/uL — ABNORMAL HIGH (ref 3.87–5.11)
RDW: 15 % (ref 11.5–15.5)
WBC: 4.1 10*3/uL (ref 4.0–10.5)
nRBC: 0 % (ref 0.0–0.2)

## 2020-09-21 LAB — BASIC METABOLIC PANEL
Anion gap: 8 (ref 5–15)
BUN: 17 mg/dL (ref 8–23)
CO2: 25 mmol/L (ref 22–32)
Calcium: 9.2 mg/dL (ref 8.9–10.3)
Chloride: 105 mmol/L (ref 98–111)
Creatinine, Ser: 1.06 mg/dL — ABNORMAL HIGH (ref 0.44–1.00)
GFR, Estimated: 54 mL/min — ABNORMAL LOW (ref >60)
Glucose, Bld: 100 mg/dL — ABNORMAL HIGH (ref 70–99)
Potassium: 3.8 mmol/L (ref 3.5–5.1)
Sodium: 138 mmol/L (ref 135–145)

## 2020-09-21 LAB — TROPONIN I (HIGH SENSITIVITY): Troponin I (High Sensitivity): <2 ng/L (ref <18)

## 2020-09-21 NOTE — ED Triage Notes (Signed)
Patient c/o central chest heaviness/burning X1 month. Reports sometimes when she lays down at night she feels radiation to her left arm

## 2020-09-21 NOTE — ED Provider Notes (Signed)
[facility] Emergency Department Provider Note  ____________________________________________   First MD Initiated Contact with Patient 09/21/20 1316     (approximate)  I have reviewed the triage vital signs and the nursing notes.   HISTORY  Chief Complaint Chest Pain   HPI Sara Ballard is a 69 y.o. female with a past medical history of anemia, arthritis, peptic ulcer disease, HTN, HDL, vitamin D deficiency, and vertigo who presents for assessment of some substernal chest pressure which she felt last night that resolved prior to my assessment and is very similar to prior episodes of been on and off for the past month.  Patient states episodes are typically at night and are not clearly exertional or positional.  No prior similar episodes.  No clear alleviating or aggravating factors.  Patient denies any headache, earache, sore throat, cough, shortness of breath, abdominal pain, acute back pain, vomiting, diarrhea, dysuria, rash, or acute extremity pain although she does note she has some chronic pain in her shoulders.  Denies EtOH or illicit drug use.  Denies tobacco abuse.  Denies any history of CAD or DVT/PE.         Past Medical History:  Diagnosis Date  . Anemia   . Arthritis   . Bilateral swelling of feet   . Complication of anesthesia    has been told she's a "light weight"  . Diabetes mellitus without complication (HCC)   . H/O vertigo   . Herpes   . Hypercholesterolemia   . Hypertension   . Peripheral vascular disease (HCC)   . Swelling of both hands   . Vitamin D deficiency   . Wears partial dentures     Patient Active Problem List   Diagnosis Date Noted  . Varicose veins of both lower extremities with pain 11/05/2016  . Pain in limb 09/27/2016  . Chronic venous insufficiency 09/27/2016    Past Surgical History:  Procedure Laterality Date  . CATARACT EXTRACTION W/PHACO Right 10/19/2019   Procedure: CATARACT EXTRACTION PHACO AND  INTRAOCULAR LENS PLACEMENT (IOC) RIGHT DIABETIC ;  Surgeon: [place] CraneKing, Bradley Mark, MD;  [facility];  Service: Ophthalmology;  Laterality: Right;  . CATARACT EXTRACTION W/PHACO Left 11/09/2019   Procedure: CATARACT EXTRACTION PHACO AND INTRAOCULAR LENS PLACEMENT (IOC) LEFT DIABETIC 2.05  00:28.0;  Surgeon: [place] CraneKing, Bradley Mark, MD;  [facility];  Service: Ophthalmology;  Laterality: Left;  . COLONOSCOPY    . ROTATOR CUFF REPAIR Right   . VARICOSE VEIN SURGERY      Prior to Admission medications   Medication Sig Start Date End Date Taking? Authorizing Provider  aspirin EC 81 MG tablet Take by mouth.    [provider]  calcium acetate (PHOSLO) 667 MG capsule Take by mouth 3 (three) times daily with meals.    [provider]  Cholecalciferol (VITAMIN D-1000 MAX ST) 1000 units tablet Take by mouth.    [provider]  ferrous sulfate 325 (65 FE) MG tablet Take 325 mg by mouth daily with breakfast.    [provider]  fexofenadine (ALLEGRA ALLERGY) 180 MG tablet Take 180 mg by mouth daily.    [provider]  Glucos-Chondroit-Collag-Hyal (GLUCOSAMINE CHONDROIT-COLLAGEN) CAPS Take by mouth.    [provider]  ketoconazole (NIZORAL) 2 % cream Apply 1 application topically daily. 04/22/18   Lutricia Feiloemer, William P, PA-C  Loratadine 10 MG CAPS Take by mouth.    [provider]  losartan (COZAAR) 100 MG tablet Take 100  mg by mouth daily.    [provider]  losartan-hydrochlorothiazide Mauri Reading) 100-25 MG tablet Take by mouth. 09/26/16   [provider]  Multiple Vitamin (MULTI-VITAMINS) TABS Take by mouth.    [provider]  Omega-3 1000 MG CAPS Take by mouth.    [provider]  polyethylene glycol powder (GLYCOLAX/MIRALAX) powder Take as directed for colonoscopy prep. 04/04/16   [provider]  potassium chloride (K-DUR,KLOR-CON) 10 MEQ tablet Take by mouth. 04/02/18  10/08/19  [provider]  pravastatin (PRAVACHOL) 10 MG tablet daily.  03/26/18   [provider]  propranolol (INDERAL) 20 MG tablet Take by mouth. 10/24/17 10/24/18  [provider]  spironolactone (ALDACTONE) 25 MG tablet Take 25 mg by mouth daily.    [provider]  Turmeric (QC TUMERIC COMPLEX) 500 MG CAPS Take by mouth.    [provider]  vitamin C (ASCORBIC ACID) 500 MG tablet Take 500 mg by mouth daily.    [provider]  Vitamin E-Tocotrienols (MAXILIFE RICE TOCOTRIENOLS PO) Take by mouth.    [provider]    Allergies Atorvastatin, Lisinopril, Amlodipine, Penicillin g, Sulfasalazine, Sulfur, and Sulfa antibiotics  Family History  Problem Relation Age of Onset  . Diabetes Mother   . Heart disease Father     Social History Social History   Tobacco Use  . Smoking status: Never Smoker  . Smokeless tobacco: Never Used  Vaping Use  . Vaping Use: Never used  Substance Use Topics  . Alcohol use: No  . Drug use: No    Review of Systems  Review of Systems  Constitutional: Negative for chills and fever.  HENT: Negative for sore throat.   Eyes: Negative for pain.  Respiratory: Negative for cough and stridor.   Cardiovascular: Positive for chest pain.  Gastrointestinal: Negative for vomiting.  Genitourinary: Negative for dysuria.  Musculoskeletal: Negative for myalgias.  Skin: Negative for rash.  Neurological: Negative for seizures, loss of consciousness and headaches.  Psychiatric/Behavioral: Negative for suicidal ideas.  All other systems reviewed and are negative.     ____________________________________________   PHYSICAL EXAM:  VITAL SIGNS: ED Triage Vitals [09/21/20 1152]  Enc Vitals Group     BP      Pulse      Resp      Temp      Temp src      SpO2      Weight 185 lb (83.9 kg)     Height 5' 4.5" (1.638 m)     Head Circumference      Peak Flow      Pain Score 5     Pain Loc       Pain Edu?      Excl. in GC?    Vitals:   09/21/20 1349  BP: 136/88  Pulse: (!) 50  Resp: 17  Temp: 98.1 F (36.7 C)  SpO2: 97%   Physical Exam Vitals and nursing note reviewed.  Constitutional:      General: She is not in acute distress.    Appearance: She is well-developed.  HENT:     Head: Normocephalic and atraumatic.     Right Ear: External ear normal.     Left Ear: External ear normal.     Nose: Nose normal.     Mouth/Throat:     Mouth: Mucous membranes are moist.  Eyes:     Conjunctiva/sclera: Conjunctivae normal.  Cardiovascular:     Rate and Rhythm: Regular rhythm.  Bradycardia present.     Heart sounds: No murmur heard.   Pulmonary:     Effort: Pulmonary effort is normal. No respiratory distress.     Breath sounds: Normal breath sounds.  Abdominal:     Palpations: Abdomen is soft.     Tenderness: There is no abdominal tenderness.  Musculoskeletal:     Cervical back: Neck supple.  Skin:    General: Skin is warm and dry.     Capillary Refill: Capillary refill takes less than 2 seconds.  Neurological:     Mental Status: She is alert and oriented to person, place, and time.  Psychiatric:        Mood and Affect: Mood normal.      ____________________________________________   LABS (all labs ordered are listed, but only abnormal results are displayed)  Labs Reviewed  BASIC METABOLIC PANEL - Abnormal; Notable for the following components:      Result Value   Glucose, Bld 100 (*)    Creatinine, Ser 1.06 (*)    GFR, Estimated 54 (*)    All other components within normal limits  CBC - Abnormal; Notable for the following components:   RBC 5.32 (*)    MCV 74.2 (*)    MCH 23.7 (*)    All other components within normal limits  TROPONIN I (HIGH SENSITIVITY)   ____________________________________________  EKG  Sinus bradycardia with a ventricular rate of 58, normal axis, unremarkable intervals, nonspecific ST changes in anterior leads and lead III and  aVL without other clear evidence of acute ischemia. ____________________________________________  RADIOLOGY  ED MD interpretation: No significant focal consolidation, effusion, thorax, edema, or other acute thoracic process.  Official radiology report(s): DG Chest 2 View  Result Date: 09/21/2020 CLINICAL DATA:  Chest pain for 1 month EXAM: CHEST - 2 VIEW COMPARISON:  01/11/2015 FINDINGS: The heart size and mediastinal contours are within normal limits. Atherosclerotic calcification of the aortic knob. Subtle linear left basilar opacity. Right lung is clear. No pleural effusion or pneumothorax. The visualized skeletal structures are unremarkable. IMPRESSION: Subtle linear left basilar opacity, favor atelectasis. Otherwise no acute cardiopulmonary findings. Electronically Signed   By: Duanne Guess D.O.   On: 09/21/2020 12:31    ____________________________________________   PROCEDURES  Procedure(s) performed (including Critical Care):  Procedures   ____________________________________________   INITIAL IMPRESSION / ASSESSMENT AND PLAN / ED COURSE        Patient presents above to history exam for assessment of some chest pain was bothering her last night and has been on and off for the past month.   Patient is afebrile and hemodynamically stable arrival.  She is noted to have a heart rate of 50 although given she denies any symptoms this is likely asymptomatic bradycardia and I would low suspicion for acute immediate life-threatening pathology related to his heart rate.  Differential includes but is not limited to ACS, PE, arrhythmia, acute anemia, gastritis/esophagitis, pneumonia, pneumothorax, CHF, and metabolic derangements.  Low suspicion for ACS given patient denies any chest pain at present and has a reassuring EKG with nonelevated troponin obtained greater than 3 hours after symptom onset.  Low suspicion for PE as patient denies any history of shortness of breath or  significant PE risk factors and has no evidence of tachycardia, tachypnea, or hypoxia on exam.  CBC does not show evidence of acute anemia and there are no significant ultralight or metabolic derangements identified on above BMP.  Chest x-ray shows no evidence of pneumonia, thorax, volume  overload or other acute thoracic process.  Overall given patient's history of PUD and description of pain esophagitis/gastritis are both high within the differential.  Patient declined antacid in the ED and states she preferred to follow-up with her PCP for this.  Given stable vital signs with otherwise reassuring work-up the patient asymptomatic at this time I believe she is safe for discharge with continued outpatient work-up and management.  Discharge stable condition.  Strict precautions advised and discussed.    ____________________________________________   FINAL CLINICAL IMPRESSION(S) / ED DIAGNOSES  Final diagnoses:  Chest pain, unspecified type  Gastroesophageal reflux disease with esophagitis, unspecified whether hemorrhage  Bradycardia    Medications - No data to display   ED Discharge Orders    None       Note:  This document was prepared using Dragon voice recognition software and may include unintentional dictation errors.   Gilles Chiquito, MD 09/21/20 1359

## 2020-09-21 NOTE — ED Notes (Signed)
Unable to complete e-sig d/t patient being in the hallway and no e-sig pad available. Pt verbalized understanding of all discharge instructions.

## 2020-10-25 DIAGNOSIS — K2100 Gastro-esophageal reflux disease with esophagitis, without bleeding: Secondary | ICD-10-CM | POA: Diagnosis not present

## 2020-10-25 DIAGNOSIS — L853 Xerosis cutis: Secondary | ICD-10-CM | POA: Diagnosis not present

## 2021-01-16 DIAGNOSIS — R1013 Epigastric pain: Secondary | ICD-10-CM | POA: Diagnosis not present

## 2021-02-09 DIAGNOSIS — Z1231 Encounter for screening mammogram for malignant neoplasm of breast: Secondary | ICD-10-CM | POA: Diagnosis not present

## 2021-02-23 DIAGNOSIS — Z0000 Encounter for general adult medical examination without abnormal findings: Secondary | ICD-10-CM | POA: Diagnosis not present

## 2021-02-23 DIAGNOSIS — I10 Essential (primary) hypertension: Secondary | ICD-10-CM | POA: Diagnosis not present

## 2021-02-23 DIAGNOSIS — S46219A Strain of muscle, fascia and tendon of other parts of biceps, unspecified arm, initial encounter: Secondary | ICD-10-CM | POA: Diagnosis not present

## 2021-02-23 DIAGNOSIS — E785 Hyperlipidemia, unspecified: Secondary | ICD-10-CM | POA: Diagnosis not present

## 2021-03-02 ENCOUNTER — Other Ambulatory Visit: Admission: RE | Admit: 2021-03-02 | Payer: PPO | Source: Ambulatory Visit

## 2021-03-06 ENCOUNTER — Ambulatory Visit: Admit: 2021-03-06 | Payer: PPO

## 2021-03-06 SURGERY — ESOPHAGOGASTRODUODENOSCOPY (EGD) WITH PROPOFOL
Anesthesia: General

## 2021-03-23 DIAGNOSIS — R7989 Other specified abnormal findings of blood chemistry: Secondary | ICD-10-CM | POA: Diagnosis not present

## 2021-07-24 DIAGNOSIS — E785 Hyperlipidemia, unspecified: Secondary | ICD-10-CM | POA: Diagnosis not present

## 2021-07-24 DIAGNOSIS — E559 Vitamin D deficiency, unspecified: Secondary | ICD-10-CM | POA: Diagnosis not present

## 2021-07-24 DIAGNOSIS — R7989 Other specified abnormal findings of blood chemistry: Secondary | ICD-10-CM | POA: Diagnosis not present

## 2021-07-24 DIAGNOSIS — I129 Hypertensive chronic kidney disease with stage 1 through stage 4 chronic kidney disease, or unspecified chronic kidney disease: Secondary | ICD-10-CM | POA: Diagnosis not present

## 2021-07-24 DIAGNOSIS — N1831 Chronic kidney disease, stage 3a: Secondary | ICD-10-CM | POA: Diagnosis not present

## 2021-08-29 DIAGNOSIS — N1831 Chronic kidney disease, stage 3a: Secondary | ICD-10-CM | POA: Diagnosis not present

## 2021-08-29 DIAGNOSIS — I10 Essential (primary) hypertension: Secondary | ICD-10-CM | POA: Diagnosis not present

## 2021-08-29 DIAGNOSIS — R300 Dysuria: Secondary | ICD-10-CM | POA: Diagnosis not present

## 2021-08-29 DIAGNOSIS — R739 Hyperglycemia, unspecified: Secondary | ICD-10-CM | POA: Diagnosis not present

## 2021-08-29 DIAGNOSIS — E7800 Pure hypercholesterolemia, unspecified: Secondary | ICD-10-CM | POA: Diagnosis not present

## 2021-09-07 DIAGNOSIS — E7800 Pure hypercholesterolemia, unspecified: Secondary | ICD-10-CM | POA: Diagnosis not present

## 2021-09-07 DIAGNOSIS — E6609 Other obesity due to excess calories: Secondary | ICD-10-CM | POA: Diagnosis not present

## 2021-09-07 DIAGNOSIS — I10 Essential (primary) hypertension: Secondary | ICD-10-CM | POA: Diagnosis not present

## 2021-09-07 DIAGNOSIS — Z23 Encounter for immunization: Secondary | ICD-10-CM | POA: Diagnosis not present

## 2021-09-07 DIAGNOSIS — Z6830 Body mass index (BMI) 30.0-30.9, adult: Secondary | ICD-10-CM | POA: Diagnosis not present

## 2021-09-29 DIAGNOSIS — Z961 Presence of intraocular lens: Secondary | ICD-10-CM | POA: Diagnosis not present

## 2021-10-03 DIAGNOSIS — E782 Mixed hyperlipidemia: Secondary | ICD-10-CM | POA: Diagnosis not present

## 2021-10-03 DIAGNOSIS — Z6830 Body mass index (BMI) 30.0-30.9, adult: Secondary | ICD-10-CM | POA: Diagnosis not present

## 2021-10-03 DIAGNOSIS — D509 Iron deficiency anemia, unspecified: Secondary | ICD-10-CM | POA: Diagnosis not present

## 2021-10-03 DIAGNOSIS — I10 Essential (primary) hypertension: Secondary | ICD-10-CM | POA: Diagnosis not present

## 2021-11-10 DIAGNOSIS — M25561 Pain in right knee: Secondary | ICD-10-CM | POA: Diagnosis not present

## 2021-11-10 DIAGNOSIS — M5441 Lumbago with sciatica, right side: Secondary | ICD-10-CM | POA: Diagnosis not present

## 2021-11-10 DIAGNOSIS — M222X1 Patellofemoral disorders, right knee: Secondary | ICD-10-CM | POA: Diagnosis not present

## 2021-11-10 DIAGNOSIS — E6609 Other obesity due to excess calories: Secondary | ICD-10-CM | POA: Diagnosis not present

## 2021-11-10 DIAGNOSIS — M5136 Other intervertebral disc degeneration, lumbar region: Secondary | ICD-10-CM | POA: Diagnosis not present

## 2021-11-10 DIAGNOSIS — Z6830 Body mass index (BMI) 30.0-30.9, adult: Secondary | ICD-10-CM | POA: Diagnosis not present

## 2021-11-10 DIAGNOSIS — R0789 Other chest pain: Secondary | ICD-10-CM | POA: Diagnosis not present

## 2022-01-28 IMAGING — CR DG CHEST 2V
2 series · 2 of 2 positions shown · non-contrast
Comparison: 01/11/2015

CLINICAL DATA: Chest pain for 1 month

EXAM:
CHEST - 2 VIEW

[chest pa]
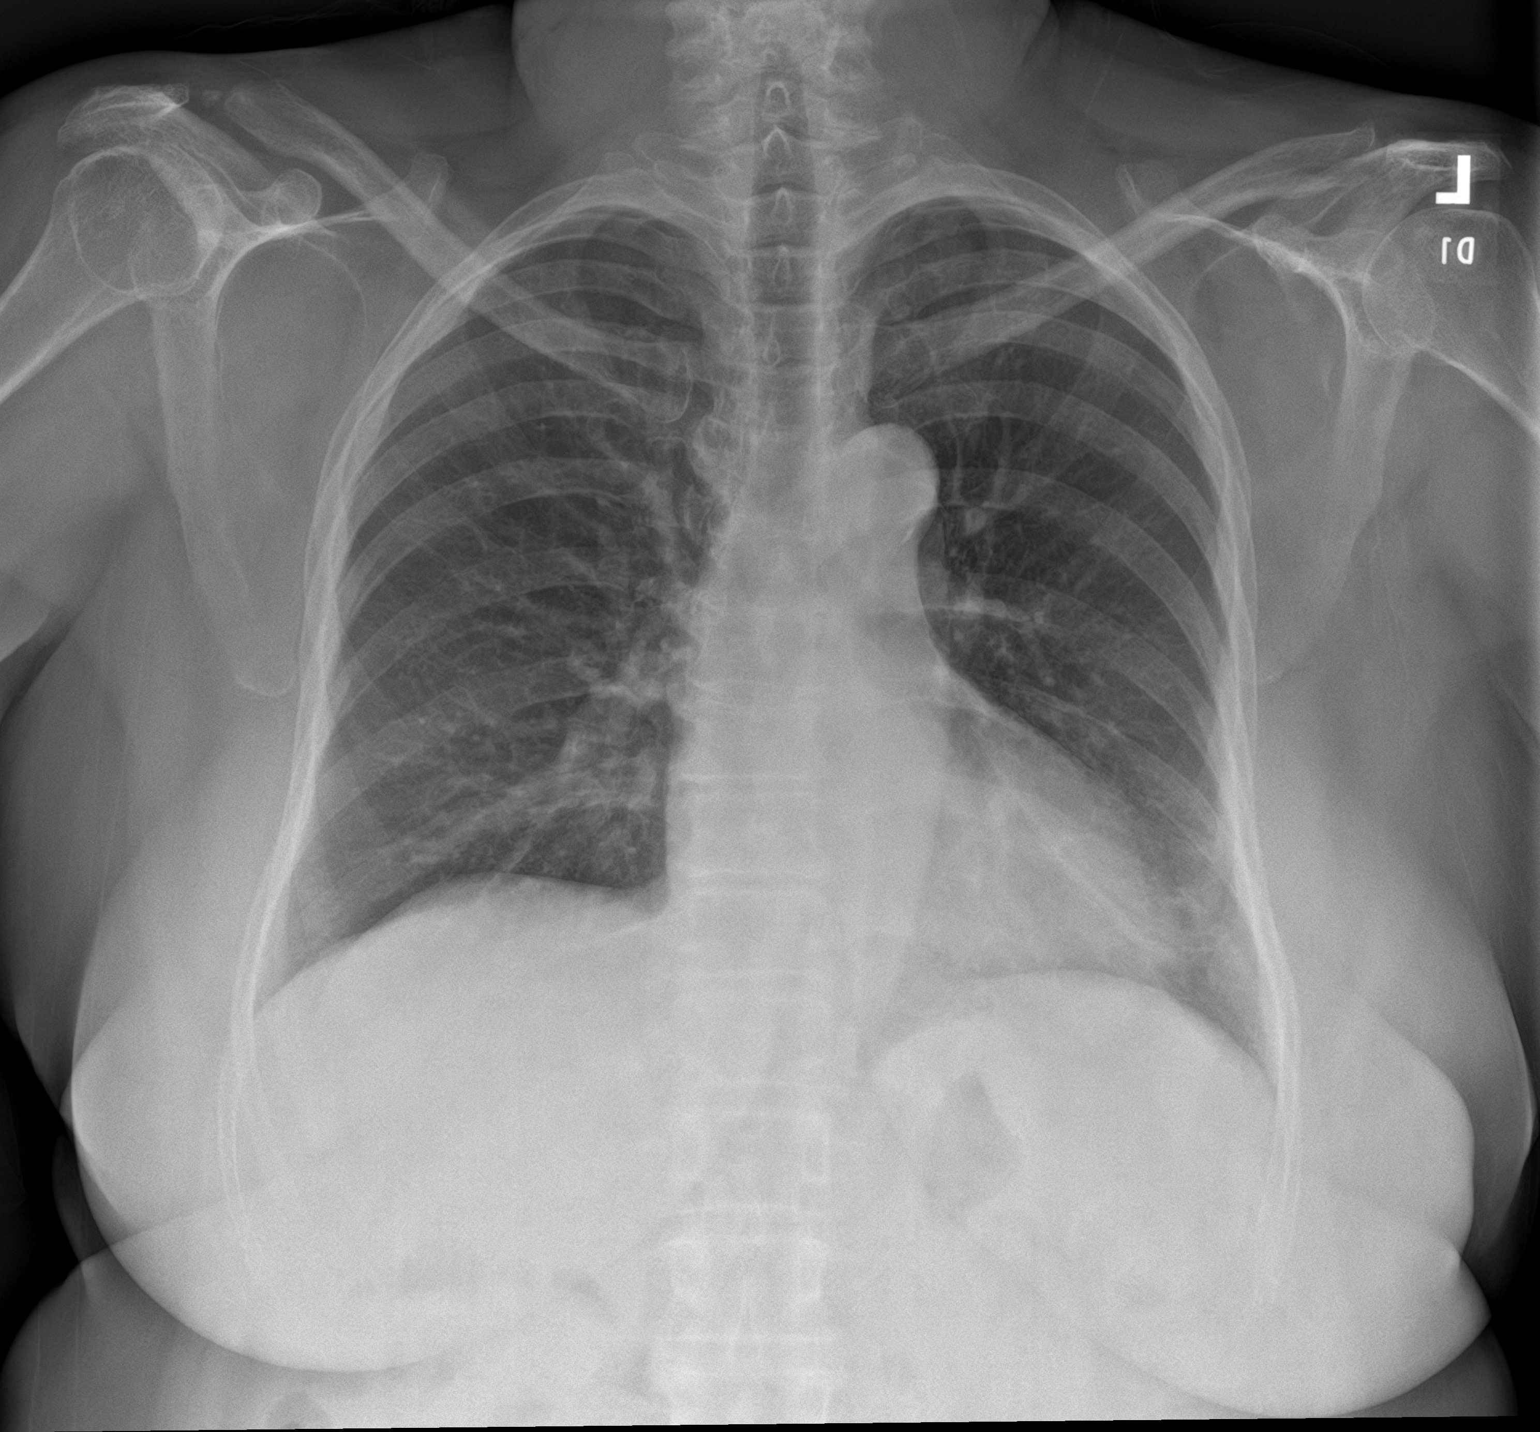

[chest lat]
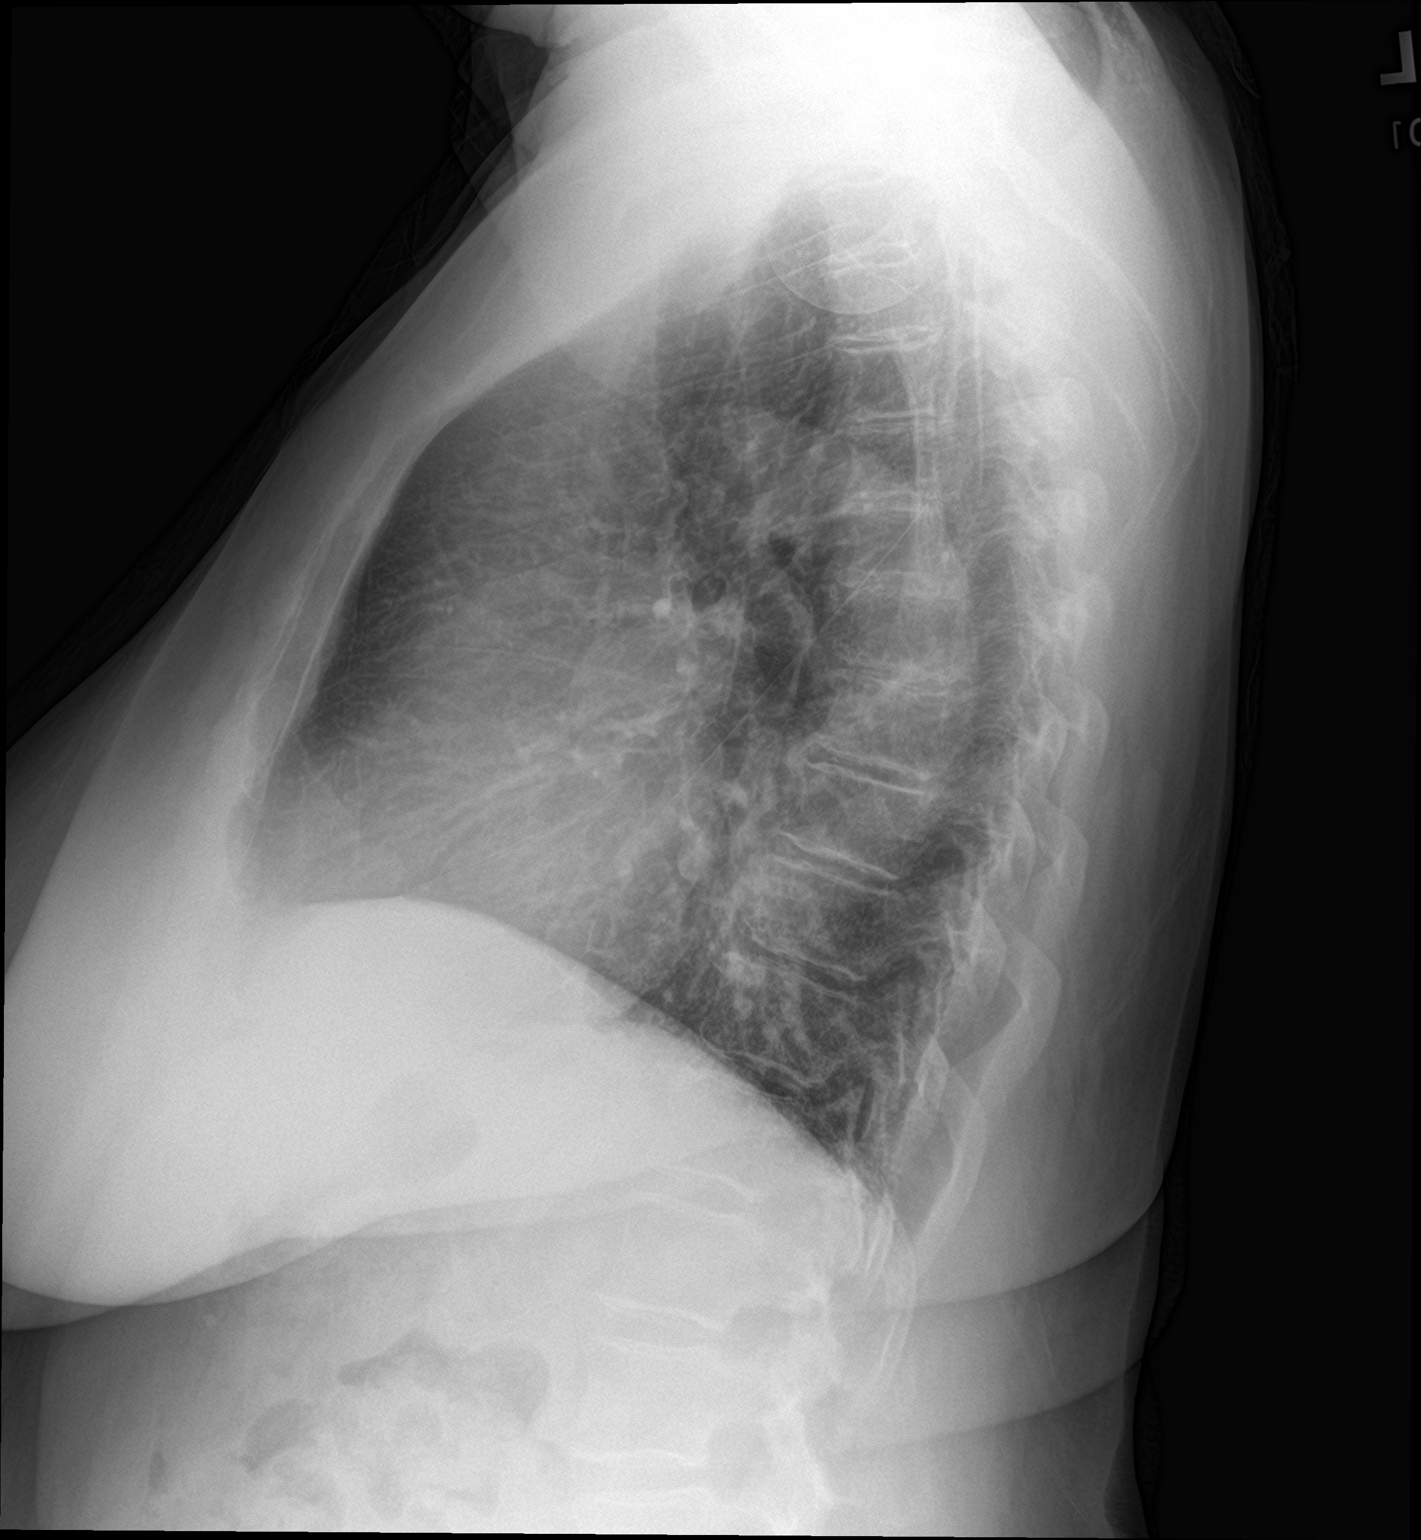

[2 of 2 positions shown; findings below may reference images not displayed]

FINDINGS: The heart size and mediastinal contours are within normal limits.
Atherosclerotic calcification of the aortic knob. Subtle linear left
basilar opacity. Right lung is clear. No pleural effusion or
pneumothorax. The visualized skeletal structures are unremarkable.
IMPRESSION: Subtle linear left basilar opacity, favor atelectasis. Otherwise no
acute cardiopulmonary findings.

## 2022-04-06 ENCOUNTER — Other Ambulatory Visit: Payer: Self-pay | Admitting: Family Medicine

## 2022-04-06 DIAGNOSIS — Z780 Asymptomatic menopausal state: Secondary | ICD-10-CM

## 2022-04-13 ENCOUNTER — Other Ambulatory Visit: Payer: Self-pay | Admitting: Family Medicine

## 2022-04-13 DIAGNOSIS — Z780 Asymptomatic menopausal state: Secondary | ICD-10-CM

## 2022-05-29 ENCOUNTER — Ambulatory Visit
Admission: RE | Admit: 2022-05-29 | Discharge: 2022-05-29 | Disposition: A | Discharge status: Home / Self Care | Payer: PPO | Source: Ambulatory Visit | Attending: Family Medicine | Admitting: Family Medicine

## 2022-05-29 DIAGNOSIS — Z780 Asymptomatic menopausal state: Secondary | ICD-10-CM | POA: Diagnosis present

## 2022-06-18 ENCOUNTER — Other Ambulatory Visit: Payer: PPO

## 2023-07-22 ENCOUNTER — Ambulatory Visit: Payer: PPO | Admitting: Dietician

## 2024-05-26 ENCOUNTER — Ambulatory Visit
Admission: RE | Admit: 2024-05-26 | Discharge: 2024-05-26 | Disposition: A | Discharge status: Home / Self Care | Attending: Family Medicine | Admitting: Family Medicine

## 2024-05-26 ENCOUNTER — Ambulatory Visit
Admission: RE | Admit: 2024-05-26 | Discharge: 2024-05-26 | Disposition: A | Discharge status: Home / Self Care | Source: Ambulatory Visit | Attending: Family Medicine | Admitting: Family Medicine

## 2024-05-26 ENCOUNTER — Other Ambulatory Visit: Payer: Self-pay | Admitting: Family Medicine

## 2024-05-26 DIAGNOSIS — M533 Sacrococcygeal disorders, not elsewhere classified: Secondary | ICD-10-CM

## 2024-05-26 DIAGNOSIS — M545 Low back pain, unspecified: Secondary | ICD-10-CM
